# Patient Record
Sex: Male | Born: 1955 | Race: White | Hispanic: No | Marital: Married | Smoking: Former smoker
Health system: Southern US, Community
[De-identification: ages and names within clinical notes are randomized; demographics above are authoritative.]

## PROBLEM LIST (undated history)

## (undated) DIAGNOSIS — R0600 Dyspnea, unspecified: Secondary | ICD-10-CM

## (undated) DIAGNOSIS — G4730 Sleep apnea, unspecified: Secondary | ICD-10-CM

## (undated) DIAGNOSIS — K219 Gastro-esophageal reflux disease without esophagitis: Secondary | ICD-10-CM

## (undated) DIAGNOSIS — E669 Obesity, unspecified: Secondary | ICD-10-CM

## (undated) DIAGNOSIS — I429 Cardiomyopathy, unspecified: Secondary | ICD-10-CM

## (undated) DIAGNOSIS — I5022 Chronic systolic (congestive) heart failure: Secondary | ICD-10-CM

## (undated) DIAGNOSIS — Z8719 Personal history of other diseases of the digestive system: Secondary | ICD-10-CM

## (undated) DIAGNOSIS — I4819 Other persistent atrial fibrillation: Secondary | ICD-10-CM

## (undated) DIAGNOSIS — Z7901 Long term (current) use of anticoagulants: Secondary | ICD-10-CM

## (undated) DIAGNOSIS — I499 Cardiac arrhythmia, unspecified: Secondary | ICD-10-CM

## (undated) DIAGNOSIS — N200 Calculus of kidney: Secondary | ICD-10-CM

## (undated) HISTORY — PX: APPENDECTOMY: SHX54

## (undated) HISTORY — PX: HERNIA REPAIR: SHX51

## (undated) HISTORY — DX: Obesity, unspecified: E66.9

## (undated) HISTORY — PX: EYE SURGERY: SHX253

## (undated) HISTORY — DX: Cardiomyopathy, unspecified: I42.9

## (undated) HISTORY — DX: Long term (current) use of anticoagulants: Z79.01

## (undated) HISTORY — DX: Dyspnea, unspecified: R06.00

---

## 2004-04-01 ENCOUNTER — Ambulatory Visit (HOSPITAL_COMMUNITY): Admission: RE | Admit: 2004-04-01 | Discharge: 2004-04-01 | Payer: Self-pay | Admitting: General Surgery

## 2004-08-02 ENCOUNTER — Ambulatory Visit (HOSPITAL_COMMUNITY): Admission: RE | Admit: 2004-08-02 | Discharge: 2004-08-02 | Payer: Self-pay | Admitting: General Surgery

## 2009-12-24 ENCOUNTER — Ambulatory Visit: Payer: Self-pay | Admitting: Internal Medicine

## 2009-12-24 DIAGNOSIS — G4730 Sleep apnea, unspecified: Secondary | ICD-10-CM

## 2010-01-07 ENCOUNTER — Ambulatory Visit (HOSPITAL_BASED_OUTPATIENT_CLINIC_OR_DEPARTMENT_OTHER): Admission: RE | Admit: 2010-01-07 | Discharge: 2010-01-07 | Payer: Self-pay | Admitting: Internal Medicine

## 2010-01-07 ENCOUNTER — Encounter: Payer: Self-pay | Admitting: Internal Medicine

## 2010-03-06 ENCOUNTER — Ambulatory Visit (HOSPITAL_BASED_OUTPATIENT_CLINIC_OR_DEPARTMENT_OTHER): Admission: RE | Admit: 2010-03-06 | Discharge: 2010-03-06 | Payer: Self-pay | Admitting: Internal Medicine

## 2010-03-06 ENCOUNTER — Encounter: Payer: Self-pay | Admitting: Internal Medicine

## 2010-03-16 ENCOUNTER — Ambulatory Visit: Payer: Self-pay | Admitting: Internal Medicine

## 2010-09-10 ENCOUNTER — Ambulatory Visit: Payer: Self-pay | Admitting: Internal Medicine

## 2010-09-12 ENCOUNTER — Telehealth: Payer: Self-pay | Admitting: Internal Medicine

## 2010-09-20 ENCOUNTER — Encounter: Payer: Self-pay | Admitting: Internal Medicine

## 2010-10-17 ENCOUNTER — Ambulatory Visit: Admit: 2010-10-17 | Payer: Self-pay | Admitting: Internal Medicine

## 2010-11-07 NOTE — Assessment & Plan Note (Signed)
Summary: sleep apnea/jd   Primary Provider/Referring Provider:  Windle Guard  CC:  Sleep Consultl .  History of Present Illness: December 24, 2009- 55 yoM referred by Dr Jeannetta Nap concerned about sleep disordered breathing.  His wife tells him he snores loudly and stops breathing. He is aware of daytime tiredess. Bedtime 10-11PM, sleep latency 10-30 minutes, unaware of waking during the night before up at 5AM. Weight gained 30 lbs in 2 years. 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 Epworth score 12/24.  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  Smoking Status:  quit Packs/Day:  1ppd  Review of Systems       The patient complains of acid heartburn, indigestion, weight change, and nasal congestion/difficulty breathing through nose.  The patient denies shortness of breath with activity, shortness of breath at rest, productive cough, non-productive cough, coughing up blood, chest pain, irregular heartbeats, abdominal pain, difficulty swallowing, sore throat, and tooth/dental problems.    Vital Signs:  Patient profile:   55 year old male Height:      74 inches Weight:      222.6 pounds BMI:     28.68 O2 Sat:      94 % on Room air Pulse rate:   83 / minute BP sitting:   118 / 80  (left arm)  Vitals Entered By: Renold Genta RCP, LPN (December 24, 2009 2:53 PM)  O2 Sat at Rest %:  94% O2 Flow:  Room air CC: Sleep Consultl   Is Patient Diabetic? Yes Comments Medications reviewed with patient .Armita Donzetta Matters RCP, LPN  December 24, 2009 3:06 PM    Physical Exam  Additional Exam:  General: A/Ox3; pleasant and cooperative, NAD, overweight SKIN: no rash, lesions NODES: no lymphadenopathy HEENT: [place]/AT, EOM- WNL, Conjuctivae- clear, PERRLA, TM-WNL, Nose- clear, Throat- clear and wnl, Mallampati  III NECK: Supple w/ fair ROM, JVD- none, normal carotid impulses w/o bruits Thyroid- normal to palpation CHEST: Clear to P&A HEART: RRR, no m/g/r heard ABDOMEN: Soft and nl; nml bowel sounds; no organomegaly or masses noted WJX:BJYN, nl pulses, no edema  NEURO: Grossly intact to observation      Impression & Recommendations:  Problem # 1:  OBSTRUCTIVE SLEEP APNEA (ICD-327.23)  Good history and consistent exam. We will schedule sleep study. Initial discussion including driving responsibility and weight loss..  Problem # 2:  ATOPIC RHINITIS (ICD-477.9)  Mainly episodic. He has noted more nasal congestion and drainage, more of the time lately. We discussed this and will give antibiotic once to see if it makes a difference acutely.  Medications Added to Medication List This Visit: 1)  Zithromax Z-pak 250 Mg Tabs (Azithromycin) .... 2 today then one daily  Other Orders: New Patient Level III (82956) Sleep Disorder Referral (Sleep Disorder)  Patient Instructions: 1)  Please schedule a follow-up appointment in 1 month. 2)  See [facility] to schedule NPSG 3)  Sample Nasonex steroid nasal spray-  1 or  2 puffs each nostril once every day at bedtime for maintenance use. 4)  Consider benadryl at night if needed to help sleep 5)  Consider a nonsedating antihistamine otc like allegra or claritin for drainage, or a decongestant like Sudafed-PE if stopped up. 6)  Script for Z pak antibiotic Prescriptions: ZITHROMAX Z-PAK 250 MG TABS (AZITHROMYCIN) 2 today then one daily  #1 pak x 0   Entered and Authorized by:   Waymon Budge  MD   Signed by:   Waymon Budge MD on 12/24/2009   Method used:   Print then Give to Patient   RxID:   [id number]

## 2010-11-07 NOTE — Assessment & Plan Note (Signed)
Summary: ROV/ MBW   Primary Provider/Referring Provider:  Windle Guard  CC:  Follow up, pt had sleep study in June 1011, and pt states he has less snoring per his wife.  History of Present Illness: History of Present Illness: December 24, 2009- 53 yoM referred by Dr Jeannetta Nap concerned about sleep disordered breathing.  His wife tells him he snores loudly and stops breathing. He is aware of daytime tiredess. Bedtime 10-11PM, sleep latency 10-30 minutes, unaware of waking during the night before up at 5AM. Weight gained 30 lbs in 2 years. 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 Epworth score 12/24.  September 10, 2010- OSA NPSG 01/14/10- Severe OSA AHI 104.6/hr CPAP 03/06/10- 21 cwp > AHI 0/ hr.  Wife says she is sleeping better because she doesn't hear him snore as much. Not as tired in daytime as he used to be, before any treatment. We reviewed his study reports and discussed treatment options. He is very interested in learning about the surgically implanted splints.   Preventive Screening-Counseling & Management  Alcohol-Tobacco     Smoking Status: quit     Year Quit: 2007  Current Medications (verified): 1)  None  Allergies (verified): No Known Drug Allergies  Past History:  Past Medical History: rhinosinusitis Obstructive sleep apnea.- NPSG 01/07/10- AHI 104.6/ hr                                         - CPAP titration 03/06/10- 21 cwp/ AHI 0 /hr  Review of Systems      See HPI  The patient denies shortness of breath with activity, shortness of breath at rest, productive cough, non-productive cough, coughing up blood, chest pain, irregular heartbeats, acid heartburn, indigestion, loss of appetite, weight change, abdominal pain, difficulty swallowing, sore throat, tooth/dental problems, headaches, nasal congestion/difficulty breathing through nose, and sneezing.    Vital Signs:  Patient profile:   55 year old  male Height:      73 inches Weight:      221.6 pounds O2 Sat:      91 % on Room air Pulse rate:   73 / minute BP sitting:   140 / 80  (left arm)  Vitals Entered By: Renold Genta RCP, LPN (September 10, 2010 3:26 PM)  O2 Flow:  Room air CC: Follow up, pt had sleep study in June 1011, pt states he has less snoring per his wife Comments Medications reviewed with patient Renold Genta RCP, LPN  September 10, 2010 3:28 PM    Physical Exam  Additional Exam:  General: A/Ox3; pleasant and cooperative, NAD, overweight, soft spoken SKIN: no rash, lesions NODES: no lymphadenopathy HEENT: [place]/AT, EOM- WNL, Conjuctivae- clear, PERRLA, TM-WNL, Nose- clear, Throat- clear and wnl, Mallampati  III NECK: Supple w/ fair ROM, JVD- none, normal carotid impulses w/o bruits Thyroid- normal to palpation CHEST: Clear to P&A HEART: RRR, no m/g/r heard ABDOMEN: overweight XBJ:YNWG, nl pulses, no edema  NEURO: Grossly intact to observation      Impression & Recommendations:  Problem # 1:  OBSTRUCTIVE SLEEP APNEA (ICD-327.23)  Severe OSA. I discussed the studies, medical concerns and treatment options. He is willing to try CPAP initially but asks that we let him look at other options with particular interest in surgical splints. I will refer him to Dr Jearld Fenton  who has that experience. I will order BiPAP to relieve pressure since standard CPAP won't reach 21 cwp. His insurance may want [place] to try CPAP first and document intolerance.   Medications Added to Medication List This Visit: 1)  Bipap 21/ 15   Other Orders: Est. Patient Level IV (04540) ENT Referral (ENT) DME Referral (DME)  Patient Instructions: 1)  Please schedule a follow-up appointment in 1 month. 2)  See [facility] to set up trial of CPAP/  BiPAP 3)  and also referral to ENT Dr Jearld Fenton

## 2010-11-07 NOTE — Progress Notes (Signed)
Summary: CPAP- try first at 200 cwp for documentation  Phone Note Call from Patient   Caller: [email or web address]  Call For: Denay Pleitez Summary of Call: does not have documentation of pt been tried on bipap in sleep ctr but titrated on cpap 21 wants to know if they([facility]) can start pt on cpap@20  and let him try and fail if he does so we can have documentation of tried and failed cpap before putting on bip or do you want a cpap/bipap study done at sleep ctr  Initial call taken by: Oneita Jolly,  September 12, 2010 11:12 AM  Follow-up for Phone Call        Appropriate to try first on CPAP at 20.  Follow-up by: Waymon Budge MD,  September 12, 2010 12:22 PM

## 2011-02-21 NOTE — Op Note (Signed)
NAMEJOLAN, Charles Kent                          ACCOUNT NO.:  [id number]   MEDICAL RECORD NO.:  [id number]                   PATIENT TYPE:  AMB   LOCATION:  DAY                                  FACILITY:  [facility]   PHYSICIAN:  Angelia Mould. Derrell Lolling, M.D.             DATE OF BIRTH:  06-Jun-1956   DATE OF PROCEDURE:  04/01/2004  DATE OF DISCHARGE:                                 OPERATIVE REPORT   PREOPERATIVE DIAGNOSIS:  Bilateral inguinal hernias.   POSTOPERATIVE DIAGNOSIS:  Bilateral inguinal hernias.   OPERATION PERFORMED:  Laparoscopic preperitoneal repair of bilateral  inguinal hernias with polypropylene mesh.   SURGEON:  Angelia Mould. Derrell Lolling, M.D.   OPERATIVE INDICATIONS:  This is a 55 year old white male who had noticed  bulge and pain in his right groin for several months.  On exam, he has a  moderately large right inguinal hernia and on the left side, he has a small  inguinal hernia.  He is brought to the operating room for repair of his  bilateral inguinal hernias.   OPERATIVE TECHNIQUE:  Following the induction of general endotracheal  anesthesia, the patient's bladder was emptied with a Foley catheter.  The  Foley was left in place, but the balloon was deflated.  The abdomen and  genitalia were prepped and draped in a sterile fashion.  Then 0.5% Marcaine  with epinephrine was used as a local infiltration anesthetic.  A transverse  incision was made just below the lower rim of the umbilicus.  The fascia was  incised transversely, exposing the right rectus muscle.  The right rectus  muscle was retracted laterally, and a balloon dissector was inserted behind  the right rectus muscle in the midline down to the symphysis pubis.  The  video camera was inserted.  The dissector ballon was inflated manually with  air under direct vision.  We had good deployment of the balloon bilaterally.  We held the balloon in place for about five minutes.  Hemostasis was  excellent.  The dissector  balloon was then deflated and removed.  The  insufflating trocar was inserted in the right rectus sheath.  The balloon  was inflated with 10 cc of saline and secured and connected to the  insufflator at 12 mmHg.  A video camera was then reinserted.  We had good  visualization.  We put a 5 mm trocar in the midline and used that to dissect  laterally.  We ultimately placed a 5 mm trocar in the right lower quadrant  and a 5 mm trocar in the left lower quadrant.   We found that the patient had a large indirect hernia on the right and a  large direct hernia on the left.  We cleaned off the symphysis pubis and  Cooper's ligaments bilaterally.  We identified the peri-epigastric vessels.  On the right side, we had some bleeding from the inferior epigastric vessels  just below  the umbilicus, and we had to control this with metal clips.  On  the right side, we dissected the cord structures free and cleaned off the  peritoneum.  We found a large indirect sac which we dissected out and away  from the cord and well back above the anterior superior iliac spine.  We had  a small hole in this, and we closed this with metal clips.  We had to vent  the peritoneal space with a Veress needle in the right upper quadrant, which  worked quite well.  The direct space on the right was a little bit weak but  no obvious hernia.  On the left side, the patient had a large direct hernia.  There was some fat caught in this, which we debrided away.  We then turned  out attention to the cord structures of the left side, which we mobilized  and isolated.  We found the edge of the peritoneum, and although it was not  really up into the internal ring, we dissected it back above the level of  the anterior superior iliac spine.   We repaired both sides with a large piece Bard 3D convex mesh. We started on  the right side and inserted the mesh and positioned it carefully so that it  overlapped the symphysis pubis and Cooper's  ligament slightly and slightly  overlapped the midline.  We secured the mesh to the superior rim of the  symphysis pubis and all along the Cooper's ligament with about five metal  screw tacks.  In the midline, we smoothed the mesh out and tacked it to the  midline and then across the posterior belly of the rectus muscle.  Laterally, we secured the mesh to the abdominal wall above the iliopubic  tract.  We were very careful to be sure that we could palpate the tacker  through the abdominal wall to be sure that all of the 5 mm screw tacks were  placed above the iliopubic tract.  This completed the repair on the right,  and it looked fine.   On the left side, we did essentially the same thing.  A large piece of 3D  Bard mesh was inserted and positioned, so as to overlap the symphysis pubis  and Cooper's ligaments slightly.  It was then tacked to the superior rim of  the symphysis pubis and Cooper's ligament, up along the midline, across the  posterior belly of the rectus muscle, and then laterally once again, we  secured the mesh laterally, to be sure that we could palpate the tip of the  tacker through the abdominal wall to be sure that all of the fixation was  above the iliopubic tract.  We then inspected the repair on both sides and  everything looked good.  The trocars were removed under direct vision, and  there was no bleeding from the trocar sites.  The pneumoperitoneum was  released.  The fascia at the umbilicus was closed with two figure-of-eight  sutures of 0 Vicryl.  Skin incisions were closed with subcuticular sutures  of 4-0 Vicryl and Steri-Strips.  Clean bandages were placed, and the patient  was taken to the recovery room in stable condition.  Estimated blood loss  was about 10 cc.  Complications were none.  All sponge, needle, and  instrument counts were correct.  Angelia Mould. Derrell Lolling, M.D.   HMI/MEDQ  D:  04/01/2004  T:   04/01/2004  Job:  [id number]   cc:   Oley Balm. Georgina Pillion, M.D.  [address]  [place]  [place] [address]  Fax: [phone]

## 2011-02-21 NOTE — Op Note (Signed)
NAMEDEVEON, KISIEL                ACCOUNT NO.:  [id number]   MEDICAL RECORD NO.:  [id number]          PATIENT TYPE:  AMB   LOCATION:  DAY                          FACILITY:  [facility]   PHYSICIAN:  Angelia Mould. Derrell Lolling, M.D.DATE OF BIRTH:  September 15, 1956   DATE OF PROCEDURE:  08/02/2004  DATE OF DISCHARGE:                                 OPERATIVE REPORT   PREOPERATIVE DIAGNOSES:  Recurrent right inguinal hernia.   POSTOPERATIVE DIAGNOSES:  Recurrent right inguinal hernia.   OPERATION PERFORMED:  Repair recurrent right inguinal hernia with mesh  Armanda Heritage repair).   SURGEON:  Angelia Mould. Derrell Lolling, M.D.   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 He presented with bilateral inguinal hernias  earlier this year. On April 01, 2004, he underwent laparoscopic preperitoneal  repair bilateral inguinal hernias with mesh. That surgery was uneventful.  He has developed a large recurrence on the right side and although he is not  very symptomatic it is quite noticeable. He was offered elective repair. He  is brought to the operating room electively.   TECHNIQUE:  Following the induction of general endotracheal anesthesia, the  patient's right groin was prepped and draped in a sterile fashion.  He was  given intravenous antibiotics.  An oblique incision was made overlying the  right inguinal canal. Marcaine 0.5% with epinephrine was used as a local  infiltration anesthetic.  Dissection was carried down through the  subcutaneous tissue exposing the aponeurosis of the external oblique. The  external oblique was incised in the direction of its fibers opening of the  external inguinal ring. Self retaining retractors were placed.  The cord  structures were then dissected and mobilized and encircled with a Penrose  drain.   I found that he had a very large direct inguinal hernia. Basically the floor  of the inguinal canal was bulging fairly  significantly.  I dissected this  sac away from the cord structures and found that the weakness extended all  the way just about to the pubic tubercle.  I opened the sac and found that  it was a fairly simple sac but thick walled. I felt within the abdominal  cavity and felt no masses or abnormal findings. I felt superiorly and felt  that I felt the lower edge of the mesh, suggesting to me that the mesh  perhaps had pulled off of Cooper's ligament.  I then closed the direct  hernia sac with a pursestring suture of 2-0 Vicryl. I reduced the direct  hernia sac and then I oversewed it with an imbricating suture of 2-0 Vicryl.   The cord structures were examined. They were quite thin and skeletonized and  there was really no evidence of any other hernia sac.   The floor of the inguinal canal was repaired and reinforced with an onlay  graft of polypropylene mesh. A 3 inch x 6 inch piece of mesh was brought to  the operative field and trimmed at the corners to fit the wound.  I made  this as large  as possible. The mesh was sutured so as to generously overlap  the fascia at the pubic tubercle, along the inguinal ligament inferiorly.  Several interrupted sutures of 2-0 Prolene were placed medially and  superiorly to secure the medial and superior rim of the mesh to the  abdominal wall fascia in the plane just deep to the external oblique.  Running suture of 2-0 Prolene was carried out superiorly to complete the  suture lines. The mesh was incised laterally so as to wrap around the cord  structures in the internal ring. The tails of the mesh were overlapped  laterally and the suture lines completed. A couple of extra sutures were  placed to close the mesh down laterally. This provided a very secure repair  both medial and lateral but allowed an adequate opening for the cord  structures. Hemostasis was excellent. The wounds were irrigated with saline.  It should be mentioned that a couple of  sensory nerves were traced lateral  to the internal ring, clamped with a hemostat, divided and tied off with 2-0  silk ties. The external oblique was closed with a running suture of 2-0  Vicryl. Scarpa's fascia was closed with 3-0 Vicryl sutures. The skin was  closed with a running subcuticular suture of 4-0 Vicryl and Steri-Strips.  Clean bandages were placed and the patient taken to the recovery room in  stable condition. Estimated blood loss was about 10-15 mL. Complications  none. Sponge, needle and instrument counts were correct.     Hayw   HMI/MEDQ  D:  08/02/2004  T:  08/02/2004  Job:  [id number]   cc:   Oley Balm. Georgina Pillion, M.D.  [address]  [place]  [place] [address]  Fax: [phone]

## 2011-10-21 ENCOUNTER — Other Ambulatory Visit (HOSPITAL_COMMUNITY): Payer: Self-pay | Admitting: Gastroenterology

## 2011-10-21 DIAGNOSIS — K219 Gastro-esophageal reflux disease without esophagitis: Secondary | ICD-10-CM

## 2011-10-27 ENCOUNTER — Ambulatory Visit (HOSPITAL_COMMUNITY)
Admission: RE | Admit: 2011-10-27 | Discharge: 2011-10-27 | Disposition: A | Discharge status: Home / Self Care | Payer: BC Managed Care – PPO | Source: Ambulatory Visit | Attending: Gastroenterology | Admitting: Gastroenterology

## 2011-10-27 DIAGNOSIS — K449 Diaphragmatic hernia without obstruction or gangrene: Secondary | ICD-10-CM | POA: Insufficient documentation

## 2011-10-27 DIAGNOSIS — K219 Gastro-esophageal reflux disease without esophagitis: Secondary | ICD-10-CM | POA: Insufficient documentation

## 2011-12-19 ENCOUNTER — Ambulatory Visit (HOSPITAL_COMMUNITY)
Admission: RE | Admit: 2011-12-19 | Discharge: 2011-12-19 | Disposition: A | Discharge status: Home / Self Care | Payer: BC Managed Care – PPO | Source: Ambulatory Visit | Attending: Otolaryngology | Admitting: Otolaryngology

## 2011-12-19 ENCOUNTER — Encounter (HOSPITAL_COMMUNITY): Payer: Self-pay | Admitting: Pharmacy Technician

## 2011-12-19 ENCOUNTER — Encounter (HOSPITAL_COMMUNITY)
Admission: RE | Admit: 2011-12-19 | Discharge: 2011-12-19 | Disposition: A | Discharge status: Home / Self Care | Payer: BC Managed Care – PPO | Source: Ambulatory Visit | Attending: Otolaryngology | Admitting: Otolaryngology

## 2011-12-19 ENCOUNTER — Encounter (HOSPITAL_COMMUNITY): Payer: Self-pay

## 2011-12-19 DIAGNOSIS — Z01818 Encounter for other preprocedural examination: Secondary | ICD-10-CM | POA: Insufficient documentation

## 2011-12-19 HISTORY — DX: Gastro-esophageal reflux disease without esophagitis: K21.9

## 2011-12-19 HISTORY — DX: Personal history of other diseases of the digestive system: Z87.19

## 2011-12-19 HISTORY — DX: Sleep apnea, unspecified: G47.30

## 2011-12-19 LAB — CBC
HCT: 48.4 % (ref 39.0–52.0)
Hemoglobin: 16.6 g/dL (ref 13.0–17.0)
MCH: 33.2 pg (ref 26.0–34.0)
MCHC: 34.3 g/dL (ref 30.0–36.0)
MCV: 96.8 fL (ref 78.0–100.0)
Platelets: 251 K/uL (ref 150–400)
RBC: 5 MIL/uL (ref 4.22–5.81)
RDW: 14.5 % (ref 11.5–15.5)
WBC: 9.7 K/uL (ref 4.0–10.5)

## 2011-12-19 LAB — DIFFERENTIAL
Basophils Absolute: 0 K/uL (ref 0.0–0.1)
Basophils Relative: 0 % (ref 0–1)
Eosinophils Absolute: 0.2 K/uL (ref 0.0–0.7)
Eosinophils Relative: 2 % (ref 0–5)
Lymphocytes Relative: 15 % (ref 12–46)
Lymphs Abs: 1.4 K/uL (ref 0.7–4.0)
Monocytes Absolute: 0.9 10*3/uL (ref 0.1–1.0)
Monocytes Relative: 9 % (ref 3–12)
Neutro Abs: 7.2 K/uL (ref 1.7–7.7)
Neutrophils Relative %: 74 % (ref 43–77)

## 2011-12-19 LAB — BASIC METABOLIC PANEL
BUN: 12 mg/dL (ref 6–23)
CO2: 29 mEq/L (ref 19–32)
Chloride: 101 mEq/L (ref 96–112)
GFR calc Af Amer: >90 mL/min (ref >90)
Glucose, Bld: 101 mg/dL — ABNORMAL HIGH (ref 70–99)
Potassium: 4.3 mEq/L (ref 3.5–5.1)

## 2011-12-19 LAB — BASIC METABOLIC PANEL WITH GFR
Calcium: 9.4 mg/dL (ref 8.4–10.5)
Creatinine, Ser: 0.75 mg/dL (ref 0.50–1.35)
GFR calc non Af Amer: >90 mL/min (ref >90)
Sodium: 140 meq/L (ref 135–145)

## 2011-12-19 LAB — SURGICAL PCR SCREEN
MRSA, PCR: NEGATIVE
Staphylococcus aureus: NEGATIVE

## 2011-12-19 NOTE — Pre-Procedure Instructions (Signed)
20 Charles Kent  12/19/2011  Your procedure is scheduled on:  Wednesday, March 20th.  Report to [facility] at 8:00AM.  Call this number if you have problems the morning of surgery: [phone]   Remember:   Do not eat food:After Midnight.  May have clear liquids: up to 4 Hours before arrival.  Clear liquids include soda, tea, black coffee, apple or grape juice, broth.  Take these medicines the morning of surgery with A SIP OF WATER: Prilosec   Do not wear jewelry, make-up or nail polish.  Do not wear lotions, powders, or perfumes. You may wear deodorant.  Do not shave 48 hours prior to surgery.  Do not bring valuables to the hospital.  Contacts, dentures or bridgework may not be worn into surgery.  Leave suitcase in the car. After surgery it may be brought to your room.  For patients admitted to the hospital, checkout time is 11:00 AM the day of discharge.   Patients discharged the day of surgery will not be allowed to drive home.  Name and phone number of your driver: ==  Special Instructions: CHG Shower Use Special Wash: 1/2 bottle night before surgery and 1/2 bottle morning of surgery.   Please read over the following fact sheets that you were given: Pain Booklet, Coughing and Deep Breathing, MRSA Information and Surgical Site Infection Prevention

## 2011-12-24 ENCOUNTER — Ambulatory Visit (HOSPITAL_COMMUNITY): Payer: BC Managed Care – PPO | Admitting: Anesthesiology

## 2011-12-24 ENCOUNTER — Encounter (HOSPITAL_COMMUNITY): Payer: Self-pay | Admitting: *Deleted

## 2011-12-24 ENCOUNTER — Encounter (HOSPITAL_COMMUNITY): Payer: Self-pay | Admitting: Otolaryngology

## 2011-12-24 ENCOUNTER — Encounter (HOSPITAL_COMMUNITY)
Admission: RE | Disposition: A | Discharge status: Home / Self Care | Payer: Self-pay | Source: Ambulatory Visit | Attending: Otolaryngology

## 2011-12-24 ENCOUNTER — Encounter (HOSPITAL_COMMUNITY): Payer: Self-pay | Admitting: Anesthesiology

## 2011-12-24 ENCOUNTER — Ambulatory Visit (HOSPITAL_COMMUNITY)
Admission: RE | Admit: 2011-12-24 | Discharge: 2011-12-25 | Disposition: A | Discharge status: Home / Self Care | Payer: BC Managed Care – PPO | Source: Ambulatory Visit | Attending: Otolaryngology | Admitting: Otolaryngology

## 2011-12-24 DIAGNOSIS — K219 Gastro-esophageal reflux disease without esophagitis: Secondary | ICD-10-CM | POA: Insufficient documentation

## 2011-12-24 DIAGNOSIS — J342 Deviated nasal septum: Secondary | ICD-10-CM | POA: Insufficient documentation

## 2011-12-24 DIAGNOSIS — J309 Allergic rhinitis, unspecified: Secondary | ICD-10-CM

## 2011-12-24 DIAGNOSIS — E669 Obesity, unspecified: Secondary | ICD-10-CM | POA: Insufficient documentation

## 2011-12-24 DIAGNOSIS — J343 Hypertrophy of nasal turbinates: Secondary | ICD-10-CM | POA: Insufficient documentation

## 2011-12-24 DIAGNOSIS — R0609 Other forms of dyspnea: Secondary | ICD-10-CM | POA: Insufficient documentation

## 2011-12-24 DIAGNOSIS — M129 Arthropathy, unspecified: Secondary | ICD-10-CM | POA: Insufficient documentation

## 2011-12-24 DIAGNOSIS — R0989 Other specified symptoms and signs involving the circulatory and respiratory systems: Secondary | ICD-10-CM | POA: Insufficient documentation

## 2011-12-24 DIAGNOSIS — G4733 Obstructive sleep apnea (adult) (pediatric): Secondary | ICD-10-CM

## 2011-12-24 HISTORY — PX: UVULOPALATOPHARYNGOPLASTY: SHX827

## 2011-12-24 HISTORY — PX: NASAL SEPTOPLASTY W/ TURBINOPLASTY: SHX2070

## 2011-12-24 SURGERY — UPPP (UVULOPALATOPHARYNGOPLASTY)
Anesthesia: General | Site: Nose | Laterality: Bilateral | Wound class: Clean Contaminated

## 2011-12-24 MED ORDER — OXYMETAZOLINE HCL 0.05 % NA SOLN
NASAL | Status: DC | PRN
  Administered 2011-12-24: 1 via NASAL

## 2011-12-24 MED ORDER — CEFAZOLIN SODIUM 1-5 GM-% IV SOLN: 1.0000 g | Freq: Three times a day (TID) | INTRAVENOUS | Status: DC

## 2011-12-24 MED ORDER — COCAINE HCL POWD
Status: DC | PRN
  Administered 2011-12-24: 200 mg via NASAL

## 2011-12-24 MED ORDER — CEFAZOLIN SODIUM 1-5 GM-% IV SOLN
1.0000 g | Freq: Three times a day (TID) | INTRAVENOUS | Status: DC
  Administered 2011-12-24 – 2011-12-25 (×3): 1 g via INTRAVENOUS
  Filled 2011-12-24 (×5): qty 50

## 2011-12-24 MED ORDER — LACTATED RINGERS IV SOLN
INTRAVENOUS | Status: DC
  Administered 2011-12-24: 10:00:00 via INTRAVENOUS

## 2011-12-24 MED ORDER — ONDANSETRON HCL 4 MG/2ML IJ SOLN
INTRAMUSCULAR | Status: DC | PRN
  Administered 2011-12-24: 4 mg via INTRAVENOUS

## 2011-12-24 MED ORDER — PROTRIPTYLINE HCL 5 MG PO TABS
5.0000 mg | ORAL_TABLET | Freq: Every day | ORAL | Status: DC
  Filled 2011-12-24: qty 1

## 2011-12-24 MED ORDER — LABETALOL HCL 5 MG/ML IV SOLN
INTRAVENOUS | Status: DC | PRN
  Administered 2011-12-24: 10 mg via INTRAVENOUS
  Administered 2011-12-24: 5 mg via INTRAVENOUS

## 2011-12-24 MED ORDER — SUCCINYLCHOLINE CHLORIDE 20 MG/ML IJ SOLN
INTRAMUSCULAR | Status: DC | PRN
  Administered 2011-12-24: 100 mg via INTRAVENOUS

## 2011-12-24 MED ORDER — OXYMETAZOLINE HCL 0.05 % NA SOLN
2.0000 | Freq: Two times a day (BID) | NASAL | Status: DC
  Administered 2011-12-24: 2 via NASAL
  Filled 2011-12-24: qty 15

## 2011-12-24 MED ORDER — AMITRIPTYLINE HCL 10 MG PO TABS
10.0000 mg | ORAL_TABLET | Freq: Every day | ORAL | Status: DC
  Administered 2011-12-24: 10 mg via ORAL
  Filled 2011-12-24 (×2): qty 1

## 2011-12-24 MED ORDER — TAMSULOSIN HCL 0.4 MG PO CAPS
0.4000 mg | ORAL_CAPSULE | Freq: Every day | ORAL | Status: DC
  Filled 2011-12-24 (×2): qty 1

## 2011-12-24 MED ORDER — COCAINE HCL POWD: 200.0000 mg | Freq: Once | Status: DC

## 2011-12-24 MED ORDER — MIDAZOLAM HCL 5 MG/5ML IJ SOLN
INTRAMUSCULAR | Status: DC | PRN
  Administered 2011-12-24: 1 mg via INTRAVENOUS

## 2011-12-24 MED ORDER — BACITRACIN ZINC 500 UNIT/GM EX OINT
TOPICAL_OINTMENT | CUTANEOUS | Status: DC | PRN
  Administered 2011-12-24: 1 via TOPICAL

## 2011-12-24 MED ORDER — PHENYLEPHRINE HCL 10 MG/ML IJ SOLN
INTRAMUSCULAR | Status: DC | PRN
  Administered 2011-12-24: 40 ug via INTRAVENOUS

## 2011-12-24 MED ORDER — HYDROCODONE-ACETAMINOPHEN 7.5-500 MG/15ML PO SOLN
10.0000 mL | ORAL | Status: DC | PRN
  Administered 2011-12-24 (×2): 15 mL via ORAL
  Administered 2011-12-25: 10 mL via ORAL
  Filled 2011-12-24 (×2): qty 15
  Filled 2011-12-24: qty 30

## 2011-12-24 MED ORDER — LIDOCAINE-EPINEPHRINE 1 %-1:100000 IJ SOLN
INTRAMUSCULAR | Status: DC | PRN
  Administered 2011-12-24: 20 mL

## 2011-12-24 MED ORDER — CEFAZOLIN SODIUM 1-5 GM-% IV SOLN
INTRAVENOUS | Status: AC
  Filled 2011-12-24: qty 50

## 2011-12-24 MED ORDER — PROPOFOL 10 MG/ML IV EMUL
INTRAVENOUS | Status: DC | PRN
  Administered 2011-12-24: 200 mg via INTRAVENOUS

## 2011-12-24 MED ORDER — 0.9 % SODIUM CHLORIDE (POUR BTL) OPTIME
TOPICAL | Status: DC | PRN
  Administered 2011-12-24: 1000 mL

## 2011-12-24 MED ORDER — SUFENTANIL CITRATE 50 MCG/ML IV SOLN
INTRAVENOUS | Status: DC | PRN
  Administered 2011-12-24: 20 ug via INTRAVENOUS
  Administered 2011-12-24 (×2): 5 ug via INTRAVENOUS
  Administered 2011-12-24: 15 ug via INTRAVENOUS

## 2011-12-24 MED ORDER — PANTOPRAZOLE SODIUM 20 MG PO TBEC
20.0000 mg | DELAYED_RELEASE_TABLET | Freq: Every day | ORAL | Status: DC
  Filled 2011-12-24 (×2): qty 1

## 2011-12-24 MED ORDER — HYDROCODONE-ACETAMINOPHEN 7.5-500 MG/15ML PO SOLN: 10.0000 mL | ORAL | Status: DC | PRN

## 2011-12-24 MED ORDER — LACTATED RINGERS IV SOLN
INTRAVENOUS | Status: DC | PRN
  Administered 2011-12-24 (×3): via INTRAVENOUS

## 2011-12-24 MED ORDER — SODIUM CHLORIDE 0.9 % IV SOLN
INTRAVENOUS | Status: DC
  Administered 2011-12-24 (×2): via INTRAVENOUS

## 2011-12-24 MED ORDER — DEXAMETHASONE SODIUM PHOSPHATE 10 MG/ML IJ SOLN
INTRAMUSCULAR | Status: DC | PRN
  Administered 2011-12-24: 10 mg via INTRAVENOUS

## 2011-12-24 MED ORDER — CEFAZOLIN SODIUM 1-5 GM-% IV SOLN
INTRAVENOUS | Status: DC | PRN
  Administered 2011-12-24: 1 g via INTRAVENOUS

## 2011-12-24 MED ORDER — HYDROMORPHONE HCL PF 1 MG/ML IJ SOLN: 0.2500 mg | INTRAMUSCULAR | Status: DC | PRN

## 2011-12-24 SURGICAL SUPPLY — 32 items
CANISTER SUCTION 2500CC (MISCELLANEOUS) ×3 IMPLANT
CLEANER TIP ELECTROSURG 2X2 (MISCELLANEOUS) ×3 IMPLANT
CLOTH BEACON ORANGE TIMEOUT ST (SAFETY) ×3 IMPLANT
COVER MAYO STAND STRL (DRAPES) ×3 IMPLANT
DECANTER SPIKE VIAL GLASS SM (MISCELLANEOUS) ×3 IMPLANT
DRESSING NASAL POPE 10X1.5X2.5 (GAUZE/BANDAGES/DRESSINGS) ×2 IMPLANT
DRESSING TELFA 8X3 (GAUZE/BANDAGES/DRESSINGS) ×3 IMPLANT
DRSG NASAL POPE 10X1.5X2.5 (GAUZE/BANDAGES/DRESSINGS) ×3
ELECT COATED BLADE 2.86 ST (ELECTRODE) ×3 IMPLANT
ELECT REM PT RETURN 9FT ADLT (ELECTROSURGICAL) ×3
ELECTRODE REM PT RTRN 9FT ADLT (ELECTROSURGICAL) ×2 IMPLANT
GLOVE ECLIPSE 6.5 STRL STRAW (GLOVE) ×3 IMPLANT
GLOVE SURG SS PI 7.5 STRL IVOR (GLOVE) ×3 IMPLANT
GOWN PREVENTION PLUS XLARGE (GOWN DISPOSABLE) ×3 IMPLANT
GOWN STRL NON-REIN LRG LVL3 (GOWN DISPOSABLE) ×3 IMPLANT
KIT BASIN OR (CUSTOM PROCEDURE TRAY) ×3 IMPLANT
KIT ROOM TURNOVER OR (KITS) ×3 IMPLANT
NEEDLE SPNL 25GX3.5 QUINCKE BL (NEEDLE) ×3 IMPLANT
NS IRRIG 1000ML POUR BTL (IV SOLUTION) ×3 IMPLANT
PAD ARMBOARD 7.5X6 YLW CONV (MISCELLANEOUS) ×3 IMPLANT
PENCIL BUTTON HOLSTER BLD 10FT (ELECTRODE) ×3 IMPLANT
SPECIMEN JAR SMALL (MISCELLANEOUS) ×3 IMPLANT
SPONGE TONSIL 1 RF SGL (DISPOSABLE) IMPLANT
SUT PLAIN 4 0 ~~LOC~~ 1 (SUTURE) ×3 IMPLANT
SUT PLAIN 5 0 P 3 18 (SUTURE) ×3 IMPLANT
SYR 3ML LL SCALE MARK (SYRINGE) ×3 IMPLANT
TOWEL OR 17X24 6PK STRL BLUE (TOWEL DISPOSABLE) ×3 IMPLANT
TOWEL OR 17X26 10 PK STRL BLUE (TOWEL DISPOSABLE) ×3 IMPLANT
TOWEL OR NON WOVEN STRL DISP B (DISPOSABLE) ×3 IMPLANT
TRAY ENT MC OR (CUSTOM PROCEDURE TRAY) ×3 IMPLANT
WATER STERILE IRR 1000ML POUR (IV SOLUTION) ×3 IMPLANT
YANKAUER SUCT BULB TIP NO VENT (SUCTIONS) ×3 IMPLANT

## 2011-12-24 NOTE — Anesthesia Preprocedure Evaluation (Signed)
Anesthesia Evaluation  Patient identified by MRN, date of birth, ID band Patient awake    Reviewed: Allergy & Precautions, H&P , Patient's Chart, lab work & pertinent test results  History of Anesthesia Complications Negative for: history of anesthetic complications  Airway Mallampati: III TM Distance: >3 FB Neck ROM: Full  Mouth opening: Limited Mouth Opening  Dental No notable dental hx. (+) Dental Advisory Given and Teeth Intact   Pulmonary sleep apnea (AHI 18, desat to 82%) , former smoker (quit 15 years) breath sounds clear to auscultation  Pulmonary exam normal       Cardiovascular negative cardio ROS  Rhythm:Regular Rate:Normal     Neuro/Psych negative neurological ROS     GI/Hepatic Neg liver ROS, GERD-  Controlled,  Endo/Other  negative endocrine ROS  Renal/GU negative Renal ROS     Musculoskeletal   Abdominal (+) + obese,   Peds  Hematology negative hematology ROS (+)   Anesthesia Other Findings   Reproductive/Obstetrics                           Anesthesia Physical Anesthesia Plan  ASA: III  Anesthesia Plan: General   Post-op Pain Management:    Induction: Intravenous  Airway Management Planned: Oral ETT  Additional Equipment:   Intra-op Plan:   Post-operative Plan: Extubation in OR  Informed Consent: I have reviewed the patients History and Physical, chart, labs and discussed the procedure including the risks, benefits and alternatives for the proposed anesthesia with the patient or authorized representative who has indicated his/her understanding and acceptance.   Dental advisory given  Plan Discussed with: CRNA and Surgeon  Anesthesia Plan Comments: (Plan routine monitors, GETA with VideoGlide intubation  )        Anesthesia Quick Evaluation

## 2011-12-24 NOTE — H&P (Signed)
NAMEJORDI, LACKO NO.:  [id number]  MEDICAL RECORD NO.:  [id number]  LOCATION:                                 FACILITY:  PHYSICIAN:  Hermelinda Medicus, M.D.   DATE OF BIRTH:  1956/03/06  DATE OF ADMISSION:  12/24/2011 DATE OF DISCHARGE:                             HISTORY & PHYSICAL   This patient is a 56 year old male, who kindly came in with a history of sleep apnea.  He was waking himself up.  He is being awakened by the snoring and obstruction and was frightening his family.  He has been on CPAP and CPAP titration where he took 21 CWP.  His AHI was 0 per hour, but he could not tolerate the CPAP and refuses to use it.  He has had an occasional PVC.  His stage 3 sleep was 0%, REM sleep was 22-23%, but he still wakes up fatigued, falls asleep when he tries to read a book or watch TV.  He is retired and has back and leg issues and history of trauma and arthritis.  He weighs 210 pounds, and his BMI is 27.  His apnea-hypopnea index is 18.1.  His respiratory disturbance index is 24.4.  Longest apnea was 40 seconds.  His longest hypopnea was 80 seconds.  His mean oxygen desaturation was 94%.  O2 nadir was 82%.  PAST MEDICAL HISTORY:  He has had appendicitis and appendectomy and a hernia repair.  He takes Prilosec.  His further medications are tamsulosin 0.4 extended release 24 hour, multivitamin tablet, potassium 99 mg tablet 2-3 daily, meloxicam 7.5 mg tablet p.r.n., protriptyline 5 mg tablet nightly, omeprazole 25 mg delayed release.  He denies on review of systems any respiratory, dyspnea, cough, wheezing, or hemoptysis.  Cardiovascular is in his HPI which will be further reviewed.  He has had a complete cardiovascular workup, which is acceptable for planned surgery under Dr. Viann Fish.  His 12-lead EKG shows some left axis deviation and left anterior hemiblock, but no cardiac contraindications for surgery.  He also has benign prostate hypertrophy,  and he is mildly overweight.  He denies any headaches, stroke, or TIA.  Denies any depression or anxiety.  Abdominal history of gastroesophageal reflux disease.  GU, hesitancy and has some cramps in his hands and arthritis in his knees.  He has been cleared for surgery by Dr. Viann Fish.  PHYSICAL EXAMINATION:  VITAL SIGNS:  Blood pressure of 120/60, 120/64 standing.  His weight is 218, height 73 inches, BMI of 29. HEENT:  His ears are completely clear.  Tympanic membranes look to be within normal limits and move well.  I removed some cerumen on his initial office visit.  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  The right side and left side of both showed turbinate hypertrophy.  His nasopharynx is clear and free of any ulceration, mass, or lesion.  His larynx is clear of ulceration, mass, or lesion.  True cords, false cords, epiglottis, base of tongue, lateral pharyngeal walls are clear of ulceration or mass.  He shows no evidence  of any infection. He also shows a slight amount of reflux in the post cricoid region but appears to be under control.  His tongue rides fairly high, is reasonably high and his palate is low, and his mouth is quite small, its AP diameter is somewhat shallow as well.  His tonsils are very small. His neck is full pushing the tongue rather high.  He is trying to lose some weight and he is totally intolerant of the CPAP, and we have no choice here but to suggest a septal reconstruction and turbinate reduction as I think this will markedly improve any future use of CPAP that is ever elected, but I think we can get him freed of his CPAP if we can trim his uvula and palate, and if he can lose some weight, his tongue will not ride so high, his palate will not be so low, and his nasal airway will be markedly improved. On further exam of his neck, he is free of any thyromegaly,  cervical adenopathy, or mass. CHEST:  Normal symmetry.  Clear to auscultation and percussion. CARDIAC:  Regular exam.  Normal S1, S2, S3, S4.  No murmurs, gallops, or rubs. ABDOMEN:  Soft, nontender.  No masses.  No aneurysm. EXTREMITIES:  Femoral pulses are within normal limits.  No bruits deformities of his extremities and back.  He does have the knee arthritis.  Normal muscle strength. NEUROLOGIC:  Oriented x3.  Cranial nerves intact.  True cords, false cords, epiglottis, base of tongue, lateral pharyngeal walls are completely clear and true cord mobility, gag reflex, tongue mobility, EOMs, facial nerve are all symmetrical as is shoulder strength.  His chest x-ray also shows no acute disease.  The patient is on for a septal reconstruction turbinate reduction and palatopharyngoplasty under general endotracheal anesthesia.  He will be kept overnight for monitoring on 23-hour observation.          ______________________________ Hermelinda Medicus, M.D.     JC/MEDQ  D:  12/23/2011  T:  12/23/2011  Job:  [id number]  cc:   Windle Guard, M.D. Georga Hacking, M.D.

## 2011-12-24 NOTE — Preoperative (Signed)
Beta Blockers   Reason not to administer Beta Blockers:Not Applicable 

## 2011-12-24 NOTE — Op Note (Signed)
NAME:  Charles Kent, Charles Kent                     ACCOUNT NO.:  MEDICAL RECORD NO.:  [id number]  LOCATION:                                 FACILITY:  PHYSICIAN:  Hermelinda Medicus, M.D.   DATE OF BIRTH:  08-Sep-1956  DATE OF PROCEDURE: DATE OF DISCHARGE:                              OPERATIVE REPORT   This patient is a failed CPAP patient who has had the CPAP turned way up to 20 and has a severe septal deviation and a low large uvula and palate, small mouth, high-arched tongue, and a full neck.  He is aware of the risks and gains that he is going to have some pain postoperatively, possibly even palatopharyngoplasty and that he is going to be on a soft bland diet for about 10 days and cannot travel for 10 days, and his nasal airway will be markedly improved postoperatively but maybe have some swelling postop.  PREOPERATIVE DIAGNOSIS:  Septal deviation, turbinate hypertrophy with low redundant palate with high-arched tongue with failed continuous positive airway pressure with an oxygen nadir of 80%, starting time 94%.  POSTOPERATIVE DIAGNOSIS:  Septal deviation, turbinate hypertrophy with low redundant palate with high-arched tongue with failed continuous positive airway pressure with an oxygen nadir of 80%, starting time 94%.  OPERATION:  Septal reconstruction, turbinate reduction, and a uvulopalatoplasty under general endotracheal anesthesia, Dr. Jairo Ben.  SURGEON:  Hermelinda Medicus, MD  ANESTHESIA:  1% Xylocaine with epinephrine as well injected into the septum and turbinates and 200 mg of topical cocaine.  PROCEDURE:  The patient was placed in supine position.  Under general endotracheal anesthesia, the patient was positioned in such a way and prepped and the usual head drape was placed.  The nose was first approached, and the septum was grossly deviated to the left blocking about 80-90% of the septum of this airway.  On that side, he had very large redundant turbinates as well  and then he had a spur on the posterior vomerine region as well.  We made an incision anterior and the hemitransfixion incision on the anterior portion of the quadrilateral cartilage worked back along the posterior quadrilateral cartilage elevating the mucoperichondrium and mucoperiosteum and then using the open and closed Jansen-Middleton's, we removed the septal deviation cartilage and bony ethmoid region.  Once this was completed, the septum was brought back to the midline and then we approached the posterior aspect of the septum, made a 2nd incision more posteriorly, and removed the septal spur and the vomerine spur using the 4-mm chisel.  Once this was completed, we then established the septum in the midline, we reduced the turbinates by just outfracturing, we did not remove any mucous membrane.  We also used the Elmed to cauterize the anterior turbinates to decrease the amount of mucous membrane edema.  Once this was completed, we closed with 4-0 plain catgut through and through septal suture as a blanket stitch and then we placed a Telfa pack on the left side and a 7.5 anesthesia trumpet on the right.  Attention was then carried to the oral cavity where a Crowe-Davis mouth gag was placed. The palate was very low and redundant  and tongue rode high.  We trimmed the palate using the Bovie electrocoagulation for hemostasis and trimmed the very large uvula to a point where we elevated the palatal and uvular region approximately 7-10 mm.  Once this was completed and all hemostasis was established with a 5-0 plain catgut, bringing the anterior and posterior mucous membrane into approximation, we then suctioned the stomach, we then checked with the nasopharynx for any further bleeding.  We removed the Telfa pack and placed a Merocel pack on the left side and then placed the anesthesia trumpet on the right. This will help him to continue his nasal airway postoperatively in the immediate  postop course.  The patient will be taken to recovery room and will be on 5100, on pulse oximeter, and his followup will be then in 5 days, then 10 days, 3 weeks, 6 weeks, 3 months, and 6 months.          ______________________________ Hermelinda Medicus, M.D.     JC/MEDQ  D:  12/24/2011  T:  12/24/2011  Job:  [id number]  cc:   Windle Guard, M.D. Georga Hacking, M.D.

## 2011-12-24 NOTE — Anesthesia Procedure Notes (Addendum)
Performed by: Glendora Score A   Procedure Name: Intubation Date/Time: 12/24/2011 10:03 AM Performed by: Glendora Score A Pre-anesthesia Checklist: Patient identified, Emergency Drugs available, Suction available and Patient being monitored Patient Re-evaluated:Patient Re-evaluated prior to inductionOxygen Delivery Method: Circle system utilized Preoxygenation: Pre-oxygenation with 100% oxygen Intubation Type: IV induction and Rapid sequence Tube type: Oral Tube size: 7.0 mm Number of attempts: 1 Airway Equipment and Method: Stylet and Video-laryngoscopy Placement Confirmation: ETT inserted through vocal cords under direct vision,  positive ETCO2 and breath sounds checked- equal and bilateral Secured at: 23 cm Tube secured with: Tape Dental Injury: Teeth and Oropharynx as per pre-operative assessment

## 2011-12-24 NOTE — Progress Notes (Signed)
Post op status excellent SAO2 93-95 on room air        No bleeding      We checked slight tenderness of calves         No evidence of    DVT

## 2011-12-24 NOTE — Anesthesia Postprocedure Evaluation (Signed)
  Anesthesia Post-op Note  Patient: Charles Kent  Procedure(s) Performed: Procedure(s) (LRB): UVULOPALATOPHARYNGOPLASTY (UPPP) (Bilateral) NASAL SEPTOPLASTY WITH TURBINATE REDUCTION (Bilateral)  Patient Location: PACU  Anesthesia Type: General  Level of Consciousness: awake, alert  and oriented  Airway and Oxygen Therapy: Patient Spontanous Breathing  Post-op Pain: mild  Post-op Assessment: Post-op Vital signs reviewed, Patient's Cardiovascular Status Stable, Respiratory Function Stable, Patent Airway, No signs of Nausea or vomiting and Pain level controlled  Post-op Vital Signs: Reviewed and stable  Complications: No apparent anesthesia complications

## 2011-12-24 NOTE — H&P (Signed)
Dictate [id number]

## 2011-12-24 NOTE — Transfer of Care (Signed)
Immediate Anesthesia Transfer of Care Note  Patient: Charles Kent  Procedure(s) Performed: Procedure(s) (LRB): UVULOPALATOPHARYNGOPLASTY (UPPP) (Bilateral) NASAL SEPTOPLASTY WITH TURBINATE REDUCTION (Bilateral)  Patient Location: PACU  Anesthesia Type: General  Level of Consciousness: awake, alert  and patient cooperative  Airway & Oxygen Therapy: Patient Spontanous Breathing and Patient connected to face mask oxygen  Post-op Assessment: Report given to PACU RN  Post vital signs: Reviewed and stable  Complications: No apparent anesthesia complications

## 2011-12-24 NOTE — Op Note (Signed)
Dictated [id number]

## 2011-12-24 NOTE — Consult Note (Signed)
Charles Kent, WICKHAM NO.: [id number]  MEDICAL RECORD NO.: [id number]  LOCATION: FACILITY:  PHYSICIAN: Hermelinda Medicus, M.D. DATE OF BIRTH: 1956/08/13  DATE OF ADMISSION: 12/24/2011  DATE OF DISCHARGE:  HISTORY & PHYSICAL  This patient is a 56 year old male, who kindly came in with a history of  sleep apnea. He was waking himself up. He is being awakened by the  snoring and obstruction and was frightening his family. He has been on  CPAP and CPAP titration where he took 21 CWP. His AHI was 0 per hour,  but he could not tolerate the CPAP and refuses to use it. He has had an  occasional PVC. His stage 3 sleep was 0%, REM sleep was 22-23%, but he  still wakes up fatigued, falls asleep when he tries to read a book or  watch TV. He is retired and has back and leg issues and history of  trauma and arthritis. He weighs 210 pounds, and his BMI is 27. His  apnea-hypopnea index is 18.1. His respiratory disturbance index is  24.4. Longest apnea was 40 seconds. His longest hypopnea was 80  seconds. His mean oxygen desaturation was 94%. O2 nadir was 82%.  PAST MEDICAL HISTORY: He has had appendicitis and appendectomy and a  hernia repair.  He takes Prilosec. His further medications are tamsulosin 0.4 extended  release 24 hour, multivitamin tablet, potassium 99 mg tablet 2-3 daily,  meloxicam 7.5 mg tablet p.r.n., protriptyline 5 mg tablet nightly,  omeprazole 25 mg delayed release.  He denies on review of systems any respiratory, dyspnea, cough,  wheezing, or hemoptysis. Cardiovascular is in his HPI which will be  further reviewed. He has had a complete cardiovascular workup, which is  acceptable for planned surgery under Dr. Viann Fish. His 12-lead  EKG shows some left axis deviation and left anterior hemiblock, but no  cardiac contraindications for surgery. He also has benign prostate  hypertrophy, and he is mildly overweight. He denies any headaches,  stroke, or TIA. Denies any  depression or anxiety. Abdominal history of  gastroesophageal reflux disease. GU, hesitancy and has some cramps in  his hands and arthritis in his knees. He has been cleared for surgery  by Dr. Viann Fish.  PHYSICAL EXAMINATION: VITAL SIGNS: Blood pressure of 120/60, 120/64  standing. His weight is 218, height 73 inches, BMI of 29.  HEENT: His ears are completely clear. Tympanic membranes look to be  within normal limits and move well. I removed some cerumen on his  initial office visit. He has a very narrow nose and has a septal  deviation to the left which is blocking his nose considerably on that  left side. The right side and left side of both showed turbinate  hypertrophy. His nasopharynx is clear and free of any ulceration, mass,  or lesion. His larynx is clear of ulceration, mass, or lesion. True  cords, false cords, epiglottis, base of tongue, lateral pharyngeal walls  are clear of ulceration or mass. He shows no evidence of any infection.  He also shows a slight amount of reflux in the post cricoid region but  appears to be under control. His tongue rides fairly high, is  reasonably high and his palate is low, and his mouth is quite small, its  AP diameter is somewhat shallow as well. His tonsils are very small.  His neck is full pushing the tongue rather high. He is trying to lose  some weight and he is totally intolerant of the CPAP,  and we have no  choice here but to suggest a septal reconstruction and turbinate  reduction as I think this will markedly improve any future use of CPAP  that is ever elected, but I think we can get him freed of his CPAP if we  can trim his uvula and palate, and if he can lose some weight, his  tongue will not ride so high, his palate will not be so low, and his  nasal airway will be markedly improved.  On further exam of his neck, he is free of any thyromegaly, cervical  adenopathy, or mass.  CHEST: Normal symmetry. Clear to auscultation and  percussion.  CARDIAC: Regular exam. Normal S1, S2, S3, S4. No murmurs, gallops, or  rubs.  ABDOMEN: Soft, nontender. No masses. No aneurysm.  EXTREMITIES: Femoral pulses are within normal limits. No bruits  deformities of his extremities and back. He does have the knee  arthritis. Normal muscle strength.  NEUROLOGIC: Oriented x3. Cranial nerves intact. True cords, false  cords, epiglottis, base of tongue, lateral pharyngeal walls are  completely clear and true cord mobility, gag reflex, tongue mobility,  EOMs, facial nerve are all symmetrical as is shoulder strength.  His chest x-ray also shows no acute disease.  The patient is on for a septal reconstruction turbinate reduction and  palatopharyngoplasty under general endotracheal anesthesia. He will be  kept overnight for monitoring on 23-hour observation.  ______________________________  Hermelinda Medicus, M.D.

## 2011-12-25 NOTE — Discharge Summary (Signed)
NAMETYREASE, VANDEBERG NO.:  [id number]  MEDICAL RECORD NO.:  [id number]  LOCATION:  5121                         FACILITY:  [facility]  PHYSICIAN:  Hermelinda Medicus, M.D.   DATE OF BIRTH:  1956/06/12  DATE OF ADMISSION:  12/24/2011 DATE OF DISCHARGE:                              DISCHARGE SUMMARY   This patient is a 56 year old male, who came in with a history of sleep apnea.  He had waking himself up.  He was snoring, had obstruction, he was frightening his family.  He was on CPAP and the CPAP titration was set up at 21 CWP.  His AHI was 0 when he had the CPAP, but his CPAP was just so strong that he was refusing to use it and could not tolerate it. He had an occasional PVC, but his stage III status was 0%, REM sleep was 22-23% and he still wakes up fatigued.  When he tries to read a book or watch TV, he falls asleep.  He is retired because he has some leg issues and back issues from trauma and arthritis, but is just so fatigued, he really cannot work very well and is of concern to his wife because he is obstructing.  His BMI is 27, he weighs 210.  His apnea-hypopnea index was 18.1, respiratory disturbance RDI was 24.4, longest apnea was 40 seconds, hypopnea longest was 80 seconds.  Mean oxygen desaturation range from 94, O2 nadir of 82%.  PAST MEDICAL HISTORY:  He had appendicitis, he had an appendectomy, and he has had a hernia repair and he does have medial leg issues with back issues.  MEDICATIONS: 1. Tamsulosin 0.4 extended release. 2. Multivitamin. 3. Potassium 99 mg tablet. 4. Meloxicam 7.5, which was discontinued several days before. 5.     Protriptyline 5 nightly. 5. Omeprazole 25 delayed release.  His review of systems is one of no history of respiratory disease, cough, wheezing, or hemoptysis.  Cardiovascular was reviewed and he is in good condition.  Acceptable for surgery.  Twelve-lead EKG showed some left axis deviation with left anterior  hemiblock, but no cardiac contraindication or surgery.  He had a benign prostate hypertrophy, mildly overweight.  Denies any headache, stroke, or TIA.  Denies any depression or anxiety.  His abdominal history; he does have some mild reflux which I think will be improved with resolving his sleep apnea issues.  GU:  Some hesitancy.  He had some cramps and arthritis in his knees and legs.  PHYSICAL EXAMINATION:  VITAL SIGNS:  Reveals a blood pressure of 120/60, 120/64 standing.  Weight 218, height 73, BMI 27-29 calculated. EARS:  Clear tympanic membranes, move well. 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  Turbinate hypertrophy as well.  Concha bullosa on the right, and a large spur posteriorly as well.  Nasopharynx is free of any ulceration, mass, or lesion. THROAT:  His oral cavity is small.  His uvula and palate are very low, and his uvula is large.  Tongue rides high because of a full neck. NECK:  Free of any thyromegaly, cervical adenopathy, or mass.  His larynx is clear.  True cords. CHEST:  Clear.  No rales, rhonchi, or wheezes.  Chest x-ray was within normal limits. CARDIOVASCULAR:  Normal S1, S2.  No murmurs or gallops.  EXTREMITIES: Unremarkable.  Legs with mild tenderness.  Femoral pulses are within normal limits. ABDOMEN:  Within normal limits. NEUROLOGIC:  Alert and oriented x3.  Cranial nerves intact.  Oriented x3.  The patient underwent a septal reconstruction, turbinate reduction with uvulopalatoplasty and did very well postop.  He is being observed at 5100 for his observation for sleep apnea and a pulse oximeter.  He dropped down to the low 80s but was never needed to have any oxygen. One time, he was 56 but was not given oxygen.  He has done very well, bounced right back.  I observed him personally on 2 different occasions. When he was sleeping, he fell to 85-83%.  He does have packing on 1 side of his nose.  We  irrigated his nose on the opposite side, and the packing will be removed most likely this afternoon.  His laboratory reveals no evidence of any MRSA.  His blood sugar was 101, and he is being watched for this.  His EKG was read out as sinus rhythm, moderate left axis deviation.  His lab reveals a white count of 9.7, hematocrit 48.4, platelets 251, potassium was 4.3, sodium 140, CO2 of 29.  His both MRSA and Staph aureus were negative, so on his evaluation overnight, he has done very well.  He has a unilateral packing in his nose.  His oral cavity is clear and free of any hematoma. Minimal swelling on his palate and uvula, and he will be discharged today, with removal of packing most likely this afternoon or tomorrow. The patient has tolerated the procedure well.  His diagnoses are sleep apnea with septal deviation with nasal obstruction with failed CPAP with a very low, large uvula and palate; slight elevation of his BMI, which was 27-29; O2 nadir was 82%; history of appendectomy; history of hernia repair;  history of back and leg trauma with history of calf tenderness, no calf tenderness at this time.          ______________________________ Hermelinda Medicus, M.D.     JC/MEDQ  D:  12/25/2011  T:  12/25/2011  Job:  [id number]  cc:   Windle Guard, M.D. Georga Hacking, M.D.

## 2011-12-25 NOTE — Discharge Summary (Signed)
dictation number  [id number]

## 2011-12-25 NOTE — Discharge Summary (Signed)
NAMEREMMY, RIFFE NO.:  [id number]  MEDICAL RECORD NO.:  [id number]  LOCATION:  5121                         FACILITY:  [facility]  PHYSICIAN:  Hermelinda Medicus, M.D.   DATE OF BIRTH:  Mar 25, 1956  DATE OF ADMISSION:  12/24/2011 DATE OF DISCHARGE:                              DISCHARGE SUMMARY   HISTORY:  This patient is a 56 year old male who has had a history of sleep apneas, had been on CPAP with the CPAP titration, was up to 21 CWP.  His AHI was 0 on CPAP, but he could not tolerate the pressure of this CPAP use. She had an occasional PVC.  His stage 3 sleep was 0%, REM was 22-23%.  He wakes up fatigued, falls asleep and tries to read a book, watch TV.  He is retired, has a history of back and leg trauma, weighs 210, BMI 27.  Hypopnea index is 18, RDI was 24.4, longest apnea was 40 seconds, the longest hypopnea was 80 seconds and the mean oxygen desaturation 94%, O2 nadir of 82%.  PAST MEDICAL HISTORY:  Appendicitis and appendectomy and hernia repair.  MEDICATIONS:  Listed in the in the chart as tamsulosin 0.4, meloxicam 7.5,  potassium 99 daily, omeprazole 25 all once a day.  REVIEW OF SYSTEMS:  He denies any respiratory, cardiovascular symptoms or bowel or bladder symptoms except he does have some nighttime urination.  PHYSICAL EXAMINATION:  VITAL SIGNS: Blood pressure 120/60, weight 218, height 73, BMI 27. HEENT: Ears are clear. Membranes move well.  Oral cavity is clear except he has got a very low palate and uvula.  His septum is grossly deviated blocking his nose approximately 90% to the left. NECK: Free of any thyromegaly, cervical adenopathy or mass. No salivary gland abnormalities. The teeth and gums are unremarkable.  His larynx is clear.  True cords, false cords, epiglottis, base of tongue, lateral pharyngeal walls are clear.  CHEST: Clear.  Also chest x-ray is clear and his EKG is within normal limits.  No opening snaps, murmurs  or gallops. ABDOMEN: Mildly overweight but within normal. EXTREMITIES: Unremarkable.  No bruits.  He had a septal reconstruction, turbinate reduction under general endotracheal anesthesia along with a palpable fundoplasty and has done extremely well postoperatively. He was observed carefully on pulse oximeter.  I observed the sleeping. He never fell below 85% on my observation. Nurses said one time he fell down to 80, just 78%, was not given oxygen. He will have one packing in his nose, which will be removed today which will increase his nasal airway.  LABORATORY DATA:  He was staph negative and MRSA negative.  Also his white count was 9.9, his platelets were 251, hematocrit 48.4. Chloride was 101, and his BUN was 12 and his potassium 4.3.  DIAGNOSES:  That of septal deviation, turbinate hypertrophy with failed CPAP with redundant uvula and palate and his further diagnoses are back and leg trauma. He is disabled and history of appendectomy.          ______________________________ Hermelinda Medicus, M.D.     JC/MEDQ  D:  12/25/2011  T:  12/25/2011  Job:  [id number]  cc:   Andrey Campanile  Jeannetta Nap, M.D.

## 2011-12-26 ENCOUNTER — Encounter (HOSPITAL_COMMUNITY): Payer: Self-pay | Admitting: Otolaryngology

## 2012-09-27 ENCOUNTER — Other Ambulatory Visit: Payer: Self-pay | Admitting: Family Medicine

## 2012-09-27 ENCOUNTER — Ambulatory Visit
Admission: RE | Admit: 2012-09-27 | Discharge: 2012-09-27 | Disposition: A | Discharge status: Home / Self Care | Payer: BC Managed Care – PPO | Source: Ambulatory Visit | Attending: Family Medicine | Admitting: Family Medicine

## 2012-09-27 DIAGNOSIS — M79673 Pain in unspecified foot: Secondary | ICD-10-CM

## 2014-09-19 ENCOUNTER — Other Ambulatory Visit: Payer: Self-pay | Admitting: Family Medicine

## 2014-09-19 DIAGNOSIS — M792 Neuralgia and neuritis, unspecified: Secondary | ICD-10-CM

## 2014-09-23 ENCOUNTER — Ambulatory Visit
Admission: RE | Admit: 2014-09-23 | Discharge: 2014-09-23 | Disposition: A | Discharge status: Home / Self Care | Payer: BC Managed Care – PPO | Source: Ambulatory Visit | Attending: Family Medicine | Admitting: Family Medicine

## 2014-09-23 DIAGNOSIS — M792 Neuralgia and neuritis, unspecified: Secondary | ICD-10-CM

## 2015-06-22 ENCOUNTER — Telehealth: Payer: Self-pay | Admitting: Internal Medicine

## 2015-06-22 NOTE — Telephone Encounter (Signed)
Spoke with pt's wife. Advised her that CY is not taking any new pulmonary pts. Offered an appointment with another MD, she agreed. Pt has been scheduled with MW on 06/29/15 at 11:15am. Nothing further was needed.

## 2015-06-29 ENCOUNTER — Ambulatory Visit (INDEPENDENT_AMBULATORY_CARE_PROVIDER_SITE_OTHER): Payer: Medicare Other | Admitting: Internal Medicine

## 2015-06-29 ENCOUNTER — Encounter: Payer: Self-pay | Admitting: Internal Medicine

## 2015-06-29 VITALS — BP 114/80 | HR 68 | Ht 72.0 in | Wt 233.2 lb

## 2015-06-29 DIAGNOSIS — Z23 Encounter for immunization: Secondary | ICD-10-CM | POA: Diagnosis not present

## 2015-06-29 DIAGNOSIS — R0600 Dyspnea, unspecified: Secondary | ICD-10-CM | POA: Diagnosis not present

## 2015-06-29 DIAGNOSIS — E669 Obesity, unspecified: Secondary | ICD-10-CM

## 2015-06-29 NOTE — Progress Notes (Signed)
   Subjective:    Patient ID: Charles Kent., male    DOB: [birth year],    MRN: [id number]  HPI  1 yowm quit smoking 2006 with no resp symptoms at  [facility] =160 with progressive wt gain since then but still no problem until July 2016 with onset of variable  not reproduced with exertion so referred himself to pulmonary clinic 06/29/2015     06/29/2015 1st [place] [facility] office visit/ Talvin Christianson   Chief Complaint  Patient presents with  . Pulmonary Consult    Self referral. Pt c/o "labored breathing" for the past 2 months. He notices this most at rest.    onset was abrupt July 2016 at rest outside and inside = severity/ frequency, not reproduced with exertion or assoc with cp/palp/diaph and can last from one breath to sev min and spont resolves s rx/ assoc with sense of nasal and chest congestion but no purulent or excess secrtions  No obvious  Patterns in day to day or daytime variability or assoc chronic cough or cp or chest tightness, subjective wheeze or overt sinus or hb symptoms. No unusual exp hx or h/o childhood pna/ asthma or knowledge of premature birth.  Sleeping ok without nocturnal  or early am exacerbation  of respiratory  c/o's or need for noct saba. Also denies any obvious fluctuation of symptoms with weather or environmental changes or other aggravating or alleviating factors except as outlined above   Current Medications, Allergies, Complete Past Medical History, Past Surgical History, Family History, and Social History were reviewed in [employer] record.         Review of Systems  Constitutional: Negative for fever, chills, activity change, appetite change and unexpected weight change.  HENT: Positive for congestion. Negative for dental problem, postnasal drip, rhinorrhea, sneezing, sore throat, trouble swallowing and voice change.   Eyes: Negative for visual disturbance.  Respiratory: Positive for shortness of breath. Negative for cough and choking.     Cardiovascular: Negative for chest pain and leg swelling.  Gastrointestinal: Negative for nausea, vomiting and abdominal pain.  Genitourinary: Negative for difficulty urinating.  Musculoskeletal: Positive for arthralgias.  Skin: Negative for rash.  Psychiatric/Behavioral: Negative for behavioral problems and confusion.       Objective:   Physical Exam  amb wm nad   Wt Readings from Last 3 Encounters:  06/29/15 233 lb 3.2 oz (105.779 kg)  12/19/11 216 lb 8 oz (98.204 kg)  09/10/10 221 lb 9.6 oz (100.517 kg)    Vital signs reviewed    HEENT: nl dentition, turbinates, and orophanx. Nl external ear canals without cough reflex   NECK :  without JVD/Nodes/TM/ nl carotid upstrokes bilaterally   LUNGS: no acc muscle use, clear to A and P bilaterally without cough on insp or exp maneuvers   CV:  RRR  no s3 or murmur or increase in P2, no edema   ABD:  soft and nontender with nl excursion in the supine position. No bruits or organomegaly, bowel sounds nl  MS:  warm without deformities, calf tenderness, cyanosis or clubbing  SKIN: warm and dry without lesions    NEURO:  alert, approp, no deficits      No recent cxr on file > ordered but not done Labs ordered but not done:   Tsh, cbc, bnp, bmet   Note DgEs 10/27/11 Gastroesophageal reflux with ingestion of water.          Assessment & Plan:

## 2015-06-29 NOTE — Patient Instructions (Addendum)
Try prilosec otc 20mg   Take 30-60 min before first meal of the day and Pepcid ac (famotidine) 20 mg one @  bedtime until  Next visit  GERD (REFLUX)  is an extremely common cause of respiratory symptoms just like yours , many times with no obvious heartburn at all.    It can be treated with medication, but also with lifestyle changes including elevation of the head of your bed (ideally with 6 inch  bed blocks),  Smoking cessation, avoidance of late meals, excessive alcohol, and avoid fatty foods, chocolate, peppermint, colas, red wine, and acidic juices such as orange juice.  NO MINT OR MENTHOL PRODUCTS SO NO COUGH DROPS  USE SUGARLESS CANDY INSTEAD (Jolley ranchers or Stover's or Life Savers) or even ice chips will also do - the key is to swallow to prevent all throat clearing. NO OIL BASED VITAMINS - use powdered substitutes.   Please schedule a follow up office visit in 6 weeks, call sooner if needed with pfts  cxr ordered but not done, needs to do at f/u ov  Labs ordered not done, needs to complete at f/u ov     .

## 2015-06-30 ENCOUNTER — Encounter: Payer: Self-pay | Admitting: Internal Medicine

## 2015-06-30 DIAGNOSIS — E669 Obesity, unspecified: Secondary | ICD-10-CM | POA: Insufficient documentation

## 2015-06-30 NOTE — Assessment & Plan Note (Signed)
Body mass index is 31.62 kg/(m^2).  No results found for: TSH   Contributing to gerd tendency/ doe/reviewed the need and the process to achieve and maintain neg calorie balance > defer f/u primary care including intermittently monitoring thyroid status

## 2015-06-30 NOTE — Assessment & Plan Note (Addendum)
-   DgEs 10/27/11 Gastroesophageal reflux with ingestion of water - 06/29/2015  Walked RA x 3 laps @ 185 ft each stopped due to  End of study, nl pace, no sob or desat    Symptoms are markedly disproportionate to objective findings and not clear this is a lung problem but pt does appear to have difficult airway management issues. DDX of  difficult airways management all start with A and  include Adherence, Ace Inhibitors, Acid Reflux, Active Sinus Disease, Alpha 1 Antitripsin deficiency, Anxiety masquerading as Airways dz,  ABPA,  allergy(esp in young), Aspiration (esp in elderly), Adverse effects of meds,  Active smokers, A bunch of PE's (a small clot burden can't cause this syndrome unless there is already severe underlying pulm or vascular dz with poor reserve) plus two Bs  = Bronchiectasis and Beta blocker use..and one C= CHF  ? Acid (or non-acid) GERD > always difficult to exclude as up to 75% of pts in some series report no assoc GI/ Heartburn symptoms and note he has documented GERD and takes nothing for it > rec max (24h)  acid suppression and diet restrictions/ reviewed and instructions given in writing.   ? Anxiety suggested by resolution w/in a breath or two  ? Allergy/ asthma > difficult to exclude but absence with sleep or assoc of symptoms with environmental change rules strongly against   Needs to complete w/u with cxr and pfts ordered  Total time = 77m review case with pt/wife discussion/ counseling/ giving and going over instructions (see avs)

## 2015-08-10 ENCOUNTER — Ambulatory Visit: Payer: Medicare Other | Admitting: Internal Medicine

## 2015-08-24 ENCOUNTER — Ambulatory Visit: Payer: Medicare Other | Admitting: Internal Medicine

## 2016-05-22 ENCOUNTER — Ambulatory Visit (INDEPENDENT_AMBULATORY_CARE_PROVIDER_SITE_OTHER): Payer: Medicare Other | Admitting: Internal Medicine

## 2016-05-22 ENCOUNTER — Ambulatory Visit (INDEPENDENT_AMBULATORY_CARE_PROVIDER_SITE_OTHER)
Admission: RE | Admit: 2016-05-22 | Discharge: 2016-05-22 | Disposition: A | Discharge status: Home / Self Care | Payer: Medicare Other | Source: Ambulatory Visit | Attending: Internal Medicine | Admitting: Internal Medicine

## 2016-05-22 ENCOUNTER — Encounter: Payer: Self-pay | Admitting: Internal Medicine

## 2016-05-22 ENCOUNTER — Other Ambulatory Visit (INDEPENDENT_AMBULATORY_CARE_PROVIDER_SITE_OTHER): Payer: Medicare Other

## 2016-05-22 VITALS — BP 128/78 | HR 71 | Ht 72.0 in | Wt 245.4 lb

## 2016-05-22 DIAGNOSIS — R0600 Dyspnea, unspecified: Secondary | ICD-10-CM

## 2016-05-22 LAB — CBC WITH DIFFERENTIAL/PLATELET
BASOS ABS: 0 10*3/uL (ref 0.0–0.1)
Basophils Relative: 0.4 % (ref 0.0–3.0)
EOS ABS: 0.3 10*3/uL (ref 0.0–0.7)
Eosinophils Relative: 4.8 % (ref 0.0–5.0)
HEMATOCRIT: 44.5 % (ref 39.0–52.0)
Hemoglobin: 15.3 g/dL (ref 13.0–17.0)
LYMPHS ABS: 1.6 10*3/uL (ref 0.7–4.0)
Lymphocytes Relative: 22.9 % (ref 12.0–46.0)
MCHC: 34.5 g/dL (ref 30.0–36.0)
MCV: 96.3 fl (ref 78.0–100.0)
MONO ABS: 0.8 10*3/uL (ref 0.1–1.0)
Monocytes Relative: 11.8 % (ref 3.0–12.0)
NEUTROS PCT: 60.1 % (ref 43.0–77.0)
Neutro Abs: 4.2 10*3/uL (ref 1.4–7.7)
Platelets: 235 10*3/uL (ref 150.0–400.0)
RBC: 4.62 Mil/uL (ref 4.22–5.81)
RDW: 14.7 % (ref 11.5–15.5)
WBC: 7 10*3/uL (ref 4.0–10.5)

## 2016-05-22 MED ORDER — PANTOPRAZOLE SODIUM 40 MG PO TBEC: 40.0000 mg | DELAYED_RELEASE_TABLET | Freq: Every day | ORAL | 2 refills | Status: DC

## 2016-05-22 MED ORDER — FAMOTIDINE 20 MG PO TABS: ORAL_TABLET | ORAL | 2 refills | Status: DC

## 2016-05-22 NOTE — Progress Notes (Signed)
Subjective:    Patient ID: Charles HearingHarvey R Burgener Jr., male    DOB: Jan 29, 1956,    MRN: [id number]    Brief patient profile:  [age] yowm [facility] with progressive wt gain since then but still no problem until July 2016 with onset of variable  not reproduced with exertion so referred himself to pulmonary clinic 06/29/2015    History of Present Illness  06/29/2015 1st [place] [facility] office visit/ Wert   Chief Complaint  Patient presents with  . Pulmonary Consult    Self referral. Pt c/o "labored breathing" for the past 2 months. He notices this most at rest.    onset was abrupt July 2016 at rest outside and inside = severity/ frequency, not reproduced with exertion or assoc with cp/palp/diaph and can last from one breath to sev min and spont resolves s rx/ assoc with sense of nasal and chest congestion but no purulent or excess secretions rec Try prilosec otc 20mg   Take 30-60 min before first meal of the day and Pepcid ac (famotidine) 20 mg one @  bedtime until  Next visit GERD (REFLUX) diet  Please schedule a follow up office visit in 6 weeks, call sooner if needed with pfts > not done  cxr ordered but not done, needs to do at f/u ov  Labs ordered not done, needs to complete at f/u ov    05/22/2016  f/u ov/Wert re:  Sob since July 2016 rest and activity  Chief Complaint  Patient presents with  . Follow-up    rov per Dr. Jeannetta NapElkins for SOB.  pt c/o increased SOB, difficulty obtaining a deep breath both with and without exertion.  Also worsened by heat/humidity.     progressive wt gain with sob rest and activity but  No noct symptoms  No obvious day to day or daytime variability or assoc excess/ purulent sputum or mucus plugs or hemoptysis or cp or chest tightness, subjective wheeze or overt sinus or hb symptoms. No unusual exp hx or h/o childhood pna/ asthma or knowledge of premature birth.  Sleeping ok without nocturnal  or early am exacerbation  of  respiratory  c/o's or need for noct saba. Also denies any obvious fluctuation of symptoms with weather or environmental changes or other aggravating or alleviating factors except as outlined above   Current Medications, Allergies, Complete Past Medical History, Past Surgical History, Family History, and Social History were reviewed in [facility] electronic [facility].  ROS  The following are not active complaints unless bolded sore throat, dysphagia, dental problems, itching, sneezing,  nasal congestion or excess/ purulent secretions, ear ache,   fever, chills, sweats, unintended wt loss, classically pleuritic or exertional cp,  orthopnea pnd or leg swelling, presyncope, palpitations, abdominal pain, anorexia, nausea, vomiting, diarrhea  or change in bowel or bladder habits, change in stools or urine, dysuria,hematuria,  rash, arthralgias, visual complaints, headache, numbness, weakness or ataxia or problems with walking or coordination,  change in mood/affect or memory.                          Objective:   Physical Exam  amb obese wm nad      05/22/2016        245 06/29/15 233 lb 3.2 oz (105.779 kg)  12/19/11 216 lb 8 oz (98.204 kg)  09/10/10 221 lb 9.6 oz (100.517 kg)    Vital signs reviewed  HEENT: nl dentition, turbinates, and orophanx. Nl external ear canals without cough reflex   NECK :  without JVD/Nodes/TM/ nl carotid upstrokes bilaterally   LUNGS: no acc muscle use, clear to A and P bilaterally without cough on insp or exp maneuvers   CV:  RRR  no s3 or murmur or increase in P2, no edema   ABD:  soft and nontender with nl excursion in the supine position. No bruits or organomegaly, bowel sounds nl  MS:  warm without deformities, calf tenderness, cyanosis or clubbing  SKIN: warm and dry without lesions    NEURO:  alert, approp, no deficits       Note DgEs 10/27/11 Gastroesophageal reflux with ingestion of water.   CXR PA and Lateral:    05/22/2016 :    I personally reviewed images and agree with radiology impression as follows:    Mild cardiomegaly with chronic interstitial coarsening.    Labs ordered/ reviewed:      Chemistry      Component Value Date/Time   NA 145 05/22/2016 1716   K 4.1 05/22/2016 1716   CL 107 05/22/2016 1716   CO2 29 05/22/2016 1716   BUN 11 05/22/2016 1716   CREATININE 0.99 05/22/2016 1716      Component Value Date/Time   CALCIUM 9.5 05/22/2016 1716        Lab Results  Component Value Date   WBC 7.0 05/22/2016   HGB 15.3 05/22/2016   HCT 44.5 05/22/2016   MCV 96.3 05/22/2016   PLT 235.0 05/22/2016         Lab Results  Component Value Date   TSH 1.38 05/22/2016     Lab Results  Component Value Date   PROBNP 159.0 (H) 05/22/2016                   Assessment & Plan:

## 2016-05-22 NOTE — Patient Instructions (Addendum)
Pantoprazole (protonix) 40 mg   Take  30-60 min before first meal of the day and Pepcid (famotidine)  20 mg one @  bedtime until return to office - this is the best way to tell whether stomach acid is contributing to your problem.    GERD (REFLUX)  is an extremely common cause of respiratory symptoms just like yours , many times with no obvious heartburn at all.    It can be treated with medication, but also with lifestyle changes including elevation of the head of your bed (ideally with 6 inch  bed blocks),  Smoking cessation, avoidance of late meals, excessive alcohol, and avoid fatty foods, chocolate, peppermint, colas, red wine, and acidic juices such as orange juice.  NO MINT OR MENTHOL PRODUCTS SO NO COUGH DROPS   USE SUGARLESS CANDY INSTEAD (Jolley ranchers or Stover's or Life Savers) or even ice chips will also do - the key is to swallow to prevent all throat clearing. NO OIL BASED VITAMINS - use powdered substitutes.  Please remember to go to the lab and x-ray department downstairs for your tests - we will call you with the results when they are available.       Please schedule a follow up office visit in 6 weeks, call sooner if needed  Add ? Echo on return       .

## 2016-05-23 ENCOUNTER — Telehealth: Payer: Self-pay | Admitting: Internal Medicine

## 2016-05-23 LAB — BASIC METABOLIC PANEL
BUN: 11 mg/dL (ref 6–23)
CALCIUM: 9.5 mg/dL (ref 8.4–10.5)
CO2: 29 mEq/L (ref 19–32)
CREATININE: 0.99 mg/dL (ref 0.40–1.50)
Chloride: 107 mEq/L (ref 96–112)
GFR: 81.34 mL/min (ref >60.00)
Glucose, Bld: 84 mg/dL (ref 70–99)
Potassium: 4.1 mEq/L (ref 3.5–5.1)
Sodium: 145 mEq/L (ref 135–145)

## 2016-05-23 LAB — TSH: TSH: 1.38 u[IU]/mL (ref 0.35–4.50)

## 2016-05-23 LAB — BRAIN NATRIURETIC PEPTIDE: PRO B NATRI PEPTIDE: 159 pg/mL — AB (ref 0.0–100.0)

## 2016-05-23 NOTE — Telephone Encounter (Signed)
  Notes Recorded by Nyoka Cowden, MD on 05/23/2016 at 10:44 AM EDT Call pt: Reviewed cxr and no acute change so no change in recommendations made at ov  Patient notified.  No questions or concerns at this time. Nothing further needed.

## 2016-05-25 NOTE — Assessment & Plan Note (Signed)
-   DgEs 10/27/11 Gastroesophageal reflux with ingestion of water - 06/29/2015  Walked RA x 3 laps @ 185 ft each stopped due to  End of study, nl pace, no sob or desat  - Spirometry 05/22/2016  Restrictive changes only      Symptoms are markedly disproportionate to objective findings and not clear this is a lung problem but pt does appear to have difficult airway management issues. DDX of  difficult airways management almost all start with A and  include Adherence, Ace Inhibitors, Acid Reflux, Active Sinus Disease, Alpha 1 Antitripsin deficiency, Anxiety masquerading as Airways dz,  ABPA,  Allergy(esp in young), Aspiration (esp in elderly), Adverse effects of meds,  Active smokers, A bunch of PE's (a small clot burden can't cause this syndrome unless there is already severe underlying pulm or vascular dz with poor reserve) plus two Bs  = Bronchiectasis and Beta blocker use..and one C= CHF    Adherence is always the initial "prime suspect" and is a multilayered concern that requires a "trust but verify" approach in every patient - starting with knowing how to use medications, especially inhalers, correctly, keeping up with refills and understanding the fundamental difference between maintenance and prns vs those medications only taken for a very short course and then stopped and not refilled.   ? Acid (or non-acid) GERD > always difficult to exclude as up to 75% of pts in some series report no assoc GI/ Heartburn symptoms and he has proven GERD > rec max (24h)  acid suppression and diet restrictions/ reviewed and instructions given in writing.   ? Anxiety related to wt gain and restrictive changes detected on spirometry > dx of exclusion  ? A bunch of PE's > would not explain sob at rest = activity which is what he is describing  ? chf > note bnp can be falsely decreased in chf and his obesity places him at risk of at least diastolic dysfunction > needs echo next if not improving

## 2016-05-25 NOTE — Assessment & Plan Note (Signed)
Body mass index is 33.28 kg/m.  Lab Results  Component Value Date   TSH 1.38 05/22/2016     Contributing to gerd tendency/ doe/reviewed the need and the process to achieve and maintain neg calorie balance > defer f/u primary care including intermittently monitoring thyroid status

## 2016-05-26 NOTE — Progress Notes (Signed)
Spoke with pt and notified of results per Dr. Wert. Pt verbalized understanding and denied any questions. 

## 2016-07-07 ENCOUNTER — Encounter: Payer: Self-pay | Admitting: Internal Medicine

## 2016-07-07 ENCOUNTER — Ambulatory Visit (INDEPENDENT_AMBULATORY_CARE_PROVIDER_SITE_OTHER): Payer: Medicare Other | Admitting: Internal Medicine

## 2016-07-07 VITALS — BP 132/88 | HR 58 | Ht 72.0 in | Wt 244.4 lb

## 2016-07-07 DIAGNOSIS — Z23 Encounter for immunization: Secondary | ICD-10-CM

## 2016-07-07 DIAGNOSIS — R0600 Dyspnea, unspecified: Secondary | ICD-10-CM

## 2016-07-07 NOTE — Assessment & Plan Note (Addendum)
Body mass index is 33.15 minimal change  Lab Results  Component Value Date   TSH 1.38 05/22/2016     Contributing to gerd tendency/ doe/reviewed the need and the process to achieve and maintain neg calorie balance > defer f/u primary care including intermittently monitoring thyroid status

## 2016-07-07 NOTE — Assessment & Plan Note (Addendum)
-   DgEs 10/27/11 Gastroesophageal reflux with ingestion of water - 06/29/2015  Walked RA x 3 laps @ 185 ft each stopped due to  End of study, nl pace, no sob or desat  - Spirometry 05/22/2016  Restrictive changes only      I had an extended final summary discussion with the patient and wife reviewing all relevant studies completed to date and  lasting 15 to 20 minutes of a 25 minute visit on the following issues:    1) obesity is the main issue and contributing to restrictive changes and probable GERD   2) reconditioning advised and if not making progress CPST is next step  3) pulmonary f/u is prn

## 2016-07-07 NOTE — Progress Notes (Addendum)
Subjective:    Patient ID: [name]., male    DOB: [birth year],    MRN: [id number]    Brief patient profile:  15 yowm quit smoking 2006 with no resp symptoms at  [facility] =160 with progressive wt gain since then but still no problem until July 2016 with onset of variable  not reproduced with exertion so referred himself to pulmonary clinic 06/29/2015   Has OSA > refused cpap    History of Present Illness  06/29/2015 1st [facility] office visit/ [facility]   Chief Complaint  Patient presents with  . Pulmonary Consult    Self referral. Pt c/o "labored breathing" for the past 2 months. He notices this most at rest.    onset was abrupt July 2016 at rest outside and inside = severity/ frequency, not reproduced with exertion or assoc with cp/palp/diaph and can last from one breath to sev min and spont resolves s rx/ assoc with sense of nasal and chest congestion but no purulent or excess secretions rec Try prilosec otc 20mg   Take 30-60 min before first meal of the day and Pepcid ac (famotidine) 20 mg one @  bedtime until  Next visit GERD (REFLUX) diet  Please schedule a follow up office visit in 6 weeks, call sooner if needed with pfts > not done  cxr ordered but not done, needs to do at f/u ov  Labs ordered not done, needs to complete at f/u ov    05/22/2016  f/u ov/Wert re:  Sob since July 2016 rest and activity  Chief Complaint  Patient presents with  . Follow-up    rov per Dr. Jeannetta Nap for SOB.  pt c/o increased SOB, difficulty obtaining a deep breath both with and without exertion.  Also worsened by heat/humidity.     progressive wt gain with sob rest and activity but  No noct symptoms rec Pantoprazole (protonix) 40 mg   Take  30-60 min before first meal of the day and Pepcid (famotidine)  20 mg one @  bedtime until return to office - this is the best way to tell whether stomach acid is contributing to your problem.   GERD diet  Please remember to go to the lab and x-ray department  downstairs for your tests - we will call you with the results when they are available. Please schedule a follow up office visit in 6 weeks, call sooner if needed       07/07/2016  f/u ov/Wert re: unexplained sob x 14 months / did not take gerd rx consistently  Chief Complaint  Patient presents with  . Follow-up    Pt. states breathing is about the same but a little better, Still feeling sob, wants to discuss weightloss and how it can help his breathing   No longer doe at rest, doe = MMRC1 = can walk nl pace, flat grade, can't hurry or go uphills or steps s sob    No obvious day to day or daytime variability or assoc excess/ purulent sputum or mucus plugs or hemoptysis or cp or chest tightness, subjective wheeze or overt sinus or hb symptoms. No unusual exp hx or h/o childhood pna/ asthma or knowledge of premature birth.  Sleeping ok without nocturnal  or early am exacerbation  of respiratory  c/o's or need for noct saba. Also denies any obvious fluctuation of symptoms with weather or environmental changes or other aggravating or alleviating factors except as outlined above   Current Medications, Allergies, Complete  Past Medical History, Past Surgical History, Family History, and Social History were reviewed in [facility] electronic medical record.  ROS  The following are not active complaints unless bolded sore throat, dysphagia, dental problems, itching, sneezing,  nasal congestion or excess/ purulent secretions, ear ache,   fever, chills, sweats, unintended wt loss, classically pleuritic or exertional cp,  orthopnea pnd or leg swelling, presyncope, palpitations, abdominal pain, anorexia, nausea, vomiting, diarrhea  or change in bowel or bladder habits, change in stools or urine, dysuria,hematuria,  rash, arthralgias, visual complaints, headache, numbness, weakness or ataxia or problems with walking or coordination,  change in mood/affect or memory.               Objective:    Physical Exam  amb obese wm nad      05/22/2016        245 > 07/07/2016   244  06/29/15 233 lb 3.2 oz (105.779 kg)  12/19/11 216 lb 8 oz (98.204 kg)  09/10/10 221 lb 9.6 oz (100.517 kg)    Vital signs reviewed / sats 96% RA on arrival    HEENT: nl dentition, turbinates, and orophanx. Nl external ear canals without cough reflex   NECK :  without JVD/Nodes/TM/ nl carotid upstrokes bilaterally   LUNGS: no acc muscle use, clear to A and P bilaterally without cough on insp or exp maneuvers   CV:  RRR  no s3 or murmur or increase in P2, no edema   ABD: very obese but  soft and nontender with nl excursion in the supine position. No bruits or organomegaly, bowel sounds nl  MS:  warm without deformities, calf tenderness, cyanosis or clubbing  SKIN: warm and dry without lesions    NEURO:  alert, approp, no deficits       Note DgEs 10/27/11 Gastroesophageal reflux with ingestion of water.   CXR PA and Lateral:   05/22/2016 :    I personally reviewed images and agree with radiology impression as follows:    Mild cardiomegaly with chronic interstitial coarsening.    Labs ordered/ reviewed:      Chemistry      Component Value Date/Time   NA 145 05/22/2016 1716   K 4.1 05/22/2016 1716   CL 107 05/22/2016 1716   CO2 29 05/22/2016 1716   BUN 11 05/22/2016 1716   CREATININE 0.99 05/22/2016 1716      Component Value Date/Time   CALCIUM 9.5 05/22/2016 1716        Lab Results  Component Value Date   WBC 7.0 05/22/2016   HGB 15.3 05/22/2016   HCT 44.5 05/22/2016   MCV 96.3 05/22/2016   PLT 235.0 05/22/2016         Lab Results  Component Value Date   TSH 1.38 05/22/2016     Lab Results  Component Value Date   PROBNP 159.0 (H) 05/22/2016                   Assessment & Plan:

## 2016-07-07 NOTE — Patient Instructions (Addendum)
Weight control is simply a matter of calorie balance which needs to be tilted in your favor by eating less and exercising more.  To get the most out of exercise, you need to be continuously aware that you are short of breath, but never out of breath, for 30 minutes daily. As you improve, it will actually be easier for you to do the same amount of exercise  in  30 minutes so always push to the level where you are short of breath.  If this does not result in gradual weight reduction then I strongly recommend you see a nutritionist with a food diary x 2 weeks so that we can work out a negative calorie balance which is universally effective in steady weight loss programs.  Think of your calorie balance like you do your bank account where in this case you want the balance to go down so you must take in less calories than you burn up.  It's just that simple:  Hard to do, but easy to understand.  Good luck!     Return if not making progress with your breathing as you lose weight

## 2016-07-11 ENCOUNTER — Other Ambulatory Visit: Payer: Self-pay | Admitting: Internal Medicine

## 2016-07-11 MED ORDER — FAMOTIDINE 20 MG PO TABS: ORAL_TABLET | ORAL | 0 refills | Status: DC

## 2016-11-13 ENCOUNTER — Other Ambulatory Visit (HOSPITAL_BASED_OUTPATIENT_CLINIC_OR_DEPARTMENT_OTHER): Payer: Self-pay

## 2016-11-13 DIAGNOSIS — E669 Obesity, unspecified: Secondary | ICD-10-CM

## 2016-11-13 DIAGNOSIS — G4733 Obstructive sleep apnea (adult) (pediatric): Secondary | ICD-10-CM

## 2016-12-23 ENCOUNTER — Ambulatory Visit (HOSPITAL_BASED_OUTPATIENT_CLINIC_OR_DEPARTMENT_OTHER): Payer: Medicare Other | Attending: Family Medicine | Admitting: Internal Medicine

## 2016-12-23 VITALS — Ht 72.0 in | Wt 240.0 lb

## 2016-12-23 DIAGNOSIS — G4730 Sleep apnea, unspecified: Secondary | ICD-10-CM | POA: Diagnosis not present

## 2016-12-23 DIAGNOSIS — E669 Obesity, unspecified: Secondary | ICD-10-CM | POA: Diagnosis present

## 2016-12-23 DIAGNOSIS — G4733 Obstructive sleep apnea (adult) (pediatric): Secondary | ICD-10-CM | POA: Insufficient documentation

## 2017-01-03 NOTE — Procedures (Signed)
Patient Name: Charles Kent, Charles Kent Date: 12/23/2016 Gender: Male D.O.B: 30-Jan-1956 Age (years): 60 Referring Provider: Horald Pollen Height (inches): 72 Interpreting Physician: Baird Lyons MD, ABSM Weight (lbs): 240 RPSGT: Laren Everts BMI: 33 MRN: [id number] Neck Size: 18.00 CLINICAL INFORMATION The patient is referred for a split night study with BPAP. MEDICATIONS Medications self-administered by patient taken the night of the study : none reported  SLEEP STUDY TECHNIQUE As per the AASM Manual for the Scoring of Sleep and Associated Events v2.3 (April 2016) with a hypopnea requiring 4% desaturations.  The channels recorded and monitored were frontal, central and occipital EEG, electrooculogram (EOG), submentalis EMG (chin), nasal and oral airflow, thoracic and abdominal wall motion, anterior tibialis EMG, snore microphone, electrocardiogram, and pulse oximetry. Bi-level positive airway pressure (BiPAP) was initiated when the patient met split night criteria and was titrated according to treat sleep-disordered breathing.  RESPIRATORY PARAMETERS Diagnostic  Total AHI (/hr): 80.2 RDI (/hr): 80.6 OA Index (/hr): 72.6 CA Index (/hr): 4.6 REM AHI (/hr): 58.2 NREM AHI (/hr): 83.6 Supine AHI (/hr): 81.2 Non-supine AHI (/hr): 73.95 Min O2 Sat (%): 59.00 Mean O2 (%): 84.89 Time below 88% (min): 93.6   Titration  Optimal IPAP Pressure (cm): 19 Optimal EPAP Pressure (cm): 15 AHI at Optimal Pressure (/hr): 5.1 Min O2 at Optimal Pressure (%): 84.0 Sleep % at Optimal (%): 98 Supine % at Optimal (%): 100      SLEEP ARCHITECTURE The study was initiated at 10:32:08 PM and terminated at 5:42:10 AM. The total recorded time was 430.0 minutes. EEG confirmed total sleep time was 345.0 minutes yielding a sleep efficiency of 80.2%. Sleep onset after lights out was 24.2 minutes with a REM latency of 139.5 minutes. The patient spent 18.41% of the night in stage N1 sleep, 57.97% in stage N2 sleep,  0.00% in stage N3 and 23.62% in REM. Wake after sleep onset (WASO) was 60.8 minutes. The Arousal Index was 48.0/hour.  LEG MOVEMENT DATA The total Periodic Limb Movements of Sleep (PLMS) were 0. The PLMS index was 0.00 .  CARDIAC DATA The 2 lead EKG demonstrated atrial fibrillation. The mean heart rate was 63.56 beats per minute. Other EKG findings include: PVCs.  IMPRESSIONS - Severe obstructive sleep apnea occurred during the diagnostic portion of the study (AHI = 80.2 /hour). An optimal BiPAP pressure was selected for this patient ( 19 / 15 cm of water). CPAP at tolerated pressures did not provide adequate control of events. - Mild central sleep apnea occurred during the diagnostic portion of the study (CAI = 4.6/hour). - Severe oxygen desaturation was noted during the diagnostic portion of the study (Min O2 = 59.00%). - The patient snored with Moderate snoring volume during the diagnostic portion of the study. - EKG findings include Atrial Fibrillation with mean ventricular response rate 63/ minute. - Clinically significant periodic limb movements of sleep did not occur during the study.  DIAGNOSIS - Obstructive Sleep Apnea (327.23 [G47.33 ICD-10])  RECOMMENDATIONS - Trial of BiPAP therapy on 19/15 cm H2O with a Medium size Fisher&Paykel Full Face Mask Simplus mask and heated humidification. - Be careful with alcohol, sedatives and other CNS depressants that may worsen sleep apnea and disrupt normal sleep architecture. - Sleep hygiene should be reviewed to assess factors that may improve sleep quality. - Weight management and regular exercise should be initiated or continued.  [Electronically signed] 01/03/2017 09:34 AM  Baird Lyons MD, ABSM Diplomate, American Board of Sleep Medicine   NPI: [id number]  [place],  American Board of Sleep Medicine  ELECTRONICALLY SIGNED ON:  01/03/2017, 9:30 AM [place] PH: [phone]   FX: [phone]  [phone] [place]

## 2017-01-05 ENCOUNTER — Encounter (HOSPITAL_COMMUNITY): Payer: Self-pay | Admitting: Cardiology

## 2017-01-05 ENCOUNTER — Ambulatory Visit
Admission: RE | Admit: 2017-01-05 | Discharge: 2017-01-05 | Disposition: A | Discharge status: Home / Self Care | Payer: Medicare Other | Source: Ambulatory Visit | Attending: Cardiology | Admitting: Cardiology

## 2017-01-05 ENCOUNTER — Other Ambulatory Visit: Payer: Self-pay | Admitting: Cardiology

## 2017-01-05 DIAGNOSIS — I4819 Other persistent atrial fibrillation: Secondary | ICD-10-CM

## 2017-01-05 DIAGNOSIS — I499 Cardiac arrhythmia, unspecified: Secondary | ICD-10-CM

## 2017-01-05 DIAGNOSIS — R0681 Apnea, not elsewhere classified: Secondary | ICD-10-CM

## 2017-01-05 DIAGNOSIS — I482 Chronic atrial fibrillation, unspecified: Secondary | ICD-10-CM | POA: Diagnosis present

## 2017-01-05 DIAGNOSIS — I5022 Chronic systolic (congestive) heart failure: Secondary | ICD-10-CM

## 2017-01-05 DIAGNOSIS — Z7901 Long term (current) use of anticoagulants: Secondary | ICD-10-CM

## 2017-01-05 HISTORY — DX: Other persistent atrial fibrillation: I48.19

## 2017-01-05 HISTORY — DX: Chronic systolic (congestive) heart failure: I50.22

## 2017-01-05 NOTE — H&P (Signed)
[place] Constable    Date of visit:  01/05/2017 DOB:  Nov 16, 1955    Age:  60 yrs. Medical record number:  [id number]     Account number:  [id number] Primary Care Provider: Windle Guard ____________________________ CURRENT DIAGNOSES  1. Persistent atrial fibrillation  2. Chronic systolic heart failure  3. Dyspnea  4. Cardiomyopathy  5. Long term (current) use of anticoagulants  6. Obesity  7. Sleep apnea ____________________________ ALLERGIES  NKDA ____________________________ MEDICATIONS  1. amiodarone 200 mg tablet, BID  2. diltiazem CD 120 mg capsule,extended release 24 hr, 1 p.o. daily  3. Eliquis 5 mg tablet, BID  4. lisinopril 2.5 mg tablet, 1 p.o. daily  5. magnesium 250 mg tablet, 1 p.o. daily  6. meloxicam 7.5 mg tablet, PRN  7. metoprolol succinate ER 50 mg tablet,extended release 24 hr, take one and half tablets twice a day  8. multivitamin tablet, 1 p.o. daily ____________________________ HISTORY OF PRESENT ILLNESS Patient returns for cardiac followup. He had the recent onset of atrial fibrillation and dyspnea with exertion. He was found to be in atrial fibrillation in February and had somewhat rapid ventricular response. He has untreated sleep apnea and had a recent sleep study showing severe obstructive sleep apnea that persists. He has had previous surgery for sleep apnea. He was treated initially with metoprolol and was placed on Eliquis 5 mg twice daily. An echocardiogram on February 23 showed an ejection fraction of 30% with moderate LVH and moderate to marked left atrial enlargement. He also had left axis deviation and right bundle branch block. He was then started on amiodarone on March 16 and returned to the office today for followup but we remained in atrial fibrillation. His dyspnea is improved with control of his atrial fibrillation rate. He was thought to possibly have an element of tachycardia-induced cardiomyopathy and is brought in at this time for cardioversion to  see if it can help an improvement in his LV function. His breathing has improved. He denies PND orthopnea or claudication. He has not missed any doses of his anticoagulation. ____________________________ PAST HISTORY  Past Medical Illnesses:  sleep apnea, hiatal hernia, obesity, denies hypertension or diabetes;  Cardiovascular Illnesses:  atrial fibrillation, cardiomyopathy(idiopathic);  Surgical Procedures:  appendectomy, inguinal hernia, uvulopalatopharyngoplasty Dr. Haroldine Laws 2013;  NYHA Classification:  II;  Canadian Angina Classification:  Class 0: Asymptomatic;  Cardiology Procedures-Invasive:  no history of prior cardiac procedures;  Cardiology Procedures-Noninvasive:  echocardiogram February 2018;  LVEF of 30% documented via echocardiogram on 11/28/2016,   ____________________________ CARDIO-PULMONARY TEST DATES EKG Date:  01/05/2017;  Echocardiography Date: 11/28/2016;  Chest Xray Date: 05/22/2016;   ____________________________ FAMILY HISTORY Brother -- Brother alive and well Brother -- Brother alive and well Father -- Neoplasm of kidney, Father dead Mother -- Mother dead, Heart disease ____________________________ SOCIAL HISTORY Alcohol Use:  no alcohol use;  Smoking:  used to smoke but quit 2007;  Diet:  vegetarian;  Lifestyle:  divorced and remarried;  Exercise:  some exercise;  Occupation:  retired, Probation officer;  Residence:  lives with wife;   ____________________________ REVIEW OF SYSTEMS General:  no change in weight Eyes: cataract extraction, floaters Ears, Nose, Throat, Mouth:  pulsatile tinnitis Respiratory: mild dyspnea with exertion, snoring Cardiovascular:  please review HPI Abdominal: history of GERD Genitourinary-Male: hesitancy  Musculoskeletal:  chronic back pain, arthritis of the hands, arthritis of the knees Neurological:  denies headaches, stroke, or TIA  ____________________________ PHYSICAL EXAMINATION VITAL SIGNS  Blood Pressure:  110/70 Sitting, Right arm,  large  cuff  , 100/80 Standing, Right arm and large cuff   Pulse:  80/min. Weight:  247.00 lbs. Height:  72.00"BMI: 33  Constitutional:  pleasant white male in no acute distress, moderately obese Skin:  tatoo on left arm Head:  normocephalic, normal hair pattern, no masses or tenderness Chest:  normal symmetry, clear to auscultation and percussion. Cardiac:  irregularly irregular rhythm, normal S1 and S2, no S3 or S4 Abdomen:  abdomen soft,non-tender, no masses, no hepatospenomegaly, or aneurysm noted Peripheral Pulses:  the femoral,dorsalis pedis, and posterior tibial pulses are full and equal bilaterally with no bruits auscultated. Extremities & Back:  no deformities, clubbing, cyanosis, erythema or edema observed. Normal muscle strength and tone. Neurological:  no gross motor or sensory deficits noted, affect appropriate, oriented x3. ____________________________ IMPRESSIONS/PLAN  1. Persistent atrial fibrillation 2. 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 Long-term use of anticoagulation without complications 4. Hypertensive heart disease 5. Severe sleep apnea  Recommendations:  Brought in for elective cardioversion. Cardioversion discussed with the patient and wife including risks of stroke, arrhythmia, death, or anesthesia risks. The patient understands and is willing to proceed. He will need referral for treatment of sleep apnea. greater than 40 minutes spent including greater than 50% of time face to face. ____________________________ TODAYS ORDERS  1. Comprehensive Metabolic Panel: Today  2. Complete Blood Count: Today  3. 12 Lead EKG: Today  4. Chest X-ray PA/Lat: today  5. Cardioversion Thursday                       ____________________________ Cardiology Physician:  Darden Palmer MD [facility]

## 2017-01-08 ENCOUNTER — Ambulatory Visit (HOSPITAL_COMMUNITY): Payer: Medicare Other | Admitting: Anesthesiology

## 2017-01-08 ENCOUNTER — Ambulatory Visit (HOSPITAL_COMMUNITY)
Admission: RE | Admit: 2017-01-08 | Discharge: 2017-01-08 | Disposition: A | Discharge status: Home / Self Care | Payer: Medicare Other | Source: Ambulatory Visit | Attending: Cardiology | Admitting: Cardiology

## 2017-01-08 ENCOUNTER — Encounter (HOSPITAL_COMMUNITY): Payer: Self-pay | Admitting: *Deleted

## 2017-01-08 ENCOUNTER — Encounter (HOSPITAL_COMMUNITY)
Admission: RE | Disposition: A | Discharge status: Home / Self Care | Payer: Self-pay | Source: Ambulatory Visit | Attending: Cardiology

## 2017-01-08 DIAGNOSIS — I481 Persistent atrial fibrillation: Secondary | ICD-10-CM | POA: Insufficient documentation

## 2017-01-08 DIAGNOSIS — E669 Obesity, unspecified: Secondary | ICD-10-CM | POA: Insufficient documentation

## 2017-01-08 DIAGNOSIS — K219 Gastro-esophageal reflux disease without esophagitis: Secondary | ICD-10-CM | POA: Diagnosis not present

## 2017-01-08 DIAGNOSIS — I429 Cardiomyopathy, unspecified: Secondary | ICD-10-CM | POA: Diagnosis not present

## 2017-01-08 DIAGNOSIS — Z87891 Personal history of nicotine dependence: Secondary | ICD-10-CM | POA: Diagnosis not present

## 2017-01-08 DIAGNOSIS — I4510 Unspecified right bundle-branch block: Secondary | ICD-10-CM | POA: Insufficient documentation

## 2017-01-08 DIAGNOSIS — Z6833 Body mass index (BMI) 33.0-33.9, adult: Secondary | ICD-10-CM | POA: Insufficient documentation

## 2017-01-08 DIAGNOSIS — I5022 Chronic systolic (congestive) heart failure: Secondary | ICD-10-CM | POA: Insufficient documentation

## 2017-01-08 DIAGNOSIS — Z8249 Family history of ischemic heart disease and other diseases of the circulatory system: Secondary | ICD-10-CM | POA: Insufficient documentation

## 2017-01-08 DIAGNOSIS — I110 Hypertensive heart disease with heart failure: Secondary | ICD-10-CM | POA: Insufficient documentation

## 2017-01-08 DIAGNOSIS — G4733 Obstructive sleep apnea (adult) (pediatric): Secondary | ICD-10-CM | POA: Diagnosis not present

## 2017-01-08 DIAGNOSIS — I482 Chronic atrial fibrillation, unspecified: Secondary | ICD-10-CM | POA: Diagnosis present

## 2017-01-08 DIAGNOSIS — Z7901 Long term (current) use of anticoagulants: Secondary | ICD-10-CM | POA: Insufficient documentation

## 2017-01-08 HISTORY — DX: Calculus of kidney: N20.0

## 2017-01-08 HISTORY — DX: Chronic systolic (congestive) heart failure: I50.22

## 2017-01-08 HISTORY — PX: CARDIOVERSION: SHX1299

## 2017-01-08 HISTORY — DX: Other persistent atrial fibrillation: I48.19

## 2017-01-08 SURGERY — CARDIOVERSION
Anesthesia: General

## 2017-01-08 MED ORDER — SODIUM CHLORIDE 0.9 % IV SOLN
INTRAVENOUS | Status: DC
  Administered 2017-01-08 (×2): via INTRAVENOUS

## 2017-01-08 MED ORDER — HYDROCORTISONE 1 % EX CREA: 1.0000 "application " | TOPICAL_CREAM | Freq: Three times a day (TID) | CUTANEOUS | Status: DC | PRN

## 2017-01-08 MED ORDER — LIDOCAINE HCL (CARDIAC) 20 MG/ML IV SOLN
INTRAVENOUS | Status: DC | PRN
  Administered 2017-01-08: 60 mg via INTRAVENOUS

## 2017-01-08 MED ORDER — METOPROLOL SUCCINATE ER 50 MG PO TB24: 50.0000 mg | ORAL_TABLET | Freq: Every day | ORAL | 12 refills | Status: DC

## 2017-01-08 MED ORDER — PROPOFOL 10 MG/ML IV BOLUS
INTRAVENOUS | Status: DC | PRN
  Administered 2017-01-08: 70 mg via INTRAVENOUS

## 2017-01-08 NOTE — Progress Notes (Signed)
H&P updated.  No change in condition since last seen.  Darden Palmer. MD [facility]. 1:17 PM 01/08/2017

## 2017-01-08 NOTE — Anesthesia Preprocedure Evaluation (Signed)
Anesthesia Evaluation  Patient identified by MRN, date of birth, ID band Patient awake    Reviewed: Allergy & Precautions, H&P , NPO status , Patient's Chart, lab work & pertinent test results  Airway Mallampati: II   Neck ROM: full    Dental   Pulmonary shortness of breath, sleep apnea , former smoker,    breath sounds clear to auscultation       Cardiovascular + dysrhythmias Atrial Fibrillation  Rhythm:irregular Rate:Normal     Neuro/Psych    GI/Hepatic hiatal hernia, GERD  ,  Endo/Other    Renal/GU stones     Musculoskeletal   Abdominal   Peds  Hematology   Anesthesia Other Findings   Reproductive/Obstetrics                             Anesthesia Physical Anesthesia Plan  ASA: III  Anesthesia Plan: General   Post-op Pain Management:    Induction: Intravenous  Airway Management Planned: Mask  Additional Equipment:   Intra-op Plan:   Post-operative Plan:   Informed Consent: I have reviewed the patients History and Physical, chart, labs and discussed the procedure including the risks, benefits and alternatives for the proposed anesthesia with the patient or authorized representative who has indicated his/her understanding and acceptance.     Plan Discussed with: CRNA, Anesthesiologist and Surgeon  Anesthesia Plan Comments:         Anesthesia Quick Evaluation

## 2017-01-08 NOTE — Transfer of Care (Signed)
Immediate Anesthesia Transfer of Care Note  Patient: Charles Kent.  Procedure(s) Performed: Procedure(s): CARDIOVERSION (N/A)  Patient Location: PACU and Endoscopy Unit  Anesthesia Type:General  Level of Consciousness: awake, alert  and oriented  Airway & Oxygen Therapy: Patient Spontanous Breathing and Patient connected to nasal cannula oxygen  Post-op Assessment: Report given to RN and Post -op Vital signs reviewed and stable  Post vital signs: Reviewed and stable  Last Vitals:  Vitals:   01/08/17 1327 01/08/17 1328  BP:    Pulse: (!) 37 (!) 38  Resp: 17 15  Temp:      Last Pain:  Vitals:   01/08/17 1150  TempSrc: Oral         Complications: No apparent anesthesia complications

## 2017-01-08 NOTE — CV Procedure (Signed)
Electrical Cardioversion Procedure Note  Charles Kent.   61 y.o. male MRN: [id number] DOB: 1956-01-20  Today's date: 01/08/2017  Procedure: Electrical Cardioversion  Indications:  Atrial Fibrillation  Time Out: Verified patient identification, verified procedure,medications/allergies/relevent history reviewed, required imaging and test results available.  Performed  Procedure Details  The patient was NPO after midnight. Anesthesia was administered at the beside  by Dr.Hodierne with 6o mg of lidocaine and 70 mg of propofol.  Cardioversion was done with synchronized biphasic defibrillation with AP pads with 120 watts and then 200 watts with reversion to sinus rhythm after the second shock. The patient tolerated the procedure well   IMPRESSION:  Successful cardioversion of atrial fibrillation    Charles Kent, Charles Kent. MD [facility]   01/08/2017, 1:27 PM

## 2017-01-08 NOTE — Discharge Instructions (Signed)
Electrical Cardioversion, Care After °This sheet gives you information about how to care for yourself after your procedure. Your health care provider may also give you more specific instructions. If you have problems or questions, contact your health care provider. °What can I expect after the procedure? °After the procedure, it is common to have: °· Some redness on the skin where the shocks were given. °Follow these instructions at home: °· Do not drive for 24 hours if you were given a medicine to help you relax (sedative). °· Take over-the-counter and prescription medicines only as told by your health care provider. °· Ask your health care provider how to check your pulse. Check it often. °· Rest for 48 hours after the procedure or as told by your health care provider. °· Avoid or limit your caffeine use as told by your health care provider. °Contact a health care provider if: °· You feel like your heart is beating too quickly or your pulse is not regular. °· You have a serious muscle cramp that does not go away. °Get help right away if: °· You have discomfort in your chest. °· You are dizzy or you feel faint. °· You have trouble breathing or you are short of breath. °· Your speech is slurred. °· You have trouble moving an arm or leg on one side of your body. °· Your fingers or toes turn cold or blue. °This information is not intended to replace advice given to you by your health care provider. Make sure you discuss any questions you have with your health care provider. °Document Released: 07/13/2013 Document Revised: 04/25/2016 Document Reviewed: 03/28/2016 °Elsevier Interactive Patient Education © 2017 Elsevier Inc. ° °

## 2017-01-08 NOTE — Anesthesia Procedure Notes (Signed)
Date/Time: 01/08/2017 1:15 PM Performed by: Gwenyth Allegra Pre-anesthesia Checklist: Patient identified, Emergency Drugs available, Suction available, Patient being monitored and Timeout performed Patient Re-evaluated:Patient Re-evaluated prior to inductionOxygen Delivery Method: Ambu bag Preoxygenation: Pre-oxygenation with 100% oxygen Intubation Type: IV induction

## 2017-01-09 ENCOUNTER — Telehealth: Payer: Self-pay | Admitting: Internal Medicine

## 2017-01-09 DIAGNOSIS — G4730 Sleep apnea, unspecified: Secondary | ICD-10-CM

## 2017-01-09 NOTE — Anesthesia Postprocedure Evaluation (Signed)
Anesthesia Post Note  Patient: Charles Kent.  Procedure(s) Performed: Procedure(s) (LRB): CARDIOVERSION (N/A)  Patient location during evaluation: PACU Anesthesia Type: General Level of consciousness: awake and alert and patient cooperative Pain management: pain level controlled Vital Signs Assessment: post-procedure vital signs reviewed and stable Respiratory status: spontaneous breathing and respiratory function stable Cardiovascular status: stable Anesthetic complications: no        Last Vitals:  Vitals:   01/08/17 1405 01/08/17 1415  BP: (!) 100/59 108/69  Pulse: (!) 42 (!) 45  Resp: 15 18  Temp:      Last Pain:  Vitals:   01/08/17 1330  TempSrc: Oral   Pain Goal:                 Jodeen Mclin S

## 2017-01-09 NOTE — Telephone Encounter (Signed)
Spoke with the pt's spouse  She states pt had PSG done 3/22 ordered by his PCP  They were advised that he needs CPAP asap, but wanted [place] to order  She is req appt with MW for this  None of the sleep docs have any appts available to consults this month  MW- are you okay with seeing him for this?  Please advise thanks!

## 2017-01-10 NOTE — Telephone Encounter (Signed)
Since it was a Dr Maple Hudson study yes ok to order:  - Trial of BiPAP therapy on 19/15 cm H2O with a Medium size Fisher&Paykel Full Face Mask Simplus mask and heated humidification. - Be careful with alcohol, sedatives and other CNS depressants that may worsen sleep apnea and disrupt normal sleep architecture  Needs new pt sleep consult with one of our sleep specialists

## 2017-01-12 NOTE — Telephone Encounter (Signed)
Called and spoke to pt's wife and informed her of the results and recs per MW. She verbalized understanding. Order placed and appt with RA was made for 03/13/2017. Pt's wife verbalized understanding and denied any further questions or concerns at this time.

## 2017-03-03 ENCOUNTER — Encounter: Payer: Self-pay | Admitting: Internal Medicine

## 2017-03-13 ENCOUNTER — Institutional Professional Consult (permissible substitution): Payer: Medicare Other | Admitting: Pulmonary Disease

## 2017-06-01 ENCOUNTER — Encounter: Payer: Self-pay | Admitting: Cardiology

## 2017-06-01 NOTE — Progress Notes (Signed)
[place] Constable    Date of visit:  06/01/2017 DOB:  11/15/1955    Age:  61 yrs. Medical record number:  [id number]     Account number:  [id number] Primary Care Provider: Windle Guard ____________________________ CURRENT DIAGNOSES  1. Persistent atrial fibrillation  2. Chronic systolic heart failure  3. Dyspnea  4. Cardiomyopathy  5. Long term (current) use of anticoagulants  6. Obesity  7. Sleep apnea ____________________________ ALLERGIES  NKDA ____________________________ MEDICATIONS  1. amiodarone 200 mg tablet, 1 p.o. daily  2. doxycycline hyclate 100 mg tablet, BID  3. Eliquis 5 mg tablet, BID  4. lisinopril 2.5 mg tablet, 1 p.o. daily  5. magnesium 250 mg tablet, 1 p.o. daily  6. meloxicam 7.5 mg tablet, PRN  7. metoprolol succinate ER 50 mg tablet,extended release 24 hr, BID  8. multivitamin tablet, 1 p.o. daily ____________________________ CHIEF COMPLAINTS  Followup of Persistent atrial fibrillation ____________________________ HISTORY OF PRESENT ILLNESS   Patient returns for cardiac followup. He is not feeling as well as he did after the cardioversion. He had a followup echocardiogram that showed improvement in his EF of 30% to 40% but with significant LVH. He has gone back into atrial fibrillation now however. He has been compliant with his medicines and has been seeking treatment for his sleep apnea. He previously has had head and neck surgery for this but he also has been seen at [facility] to talk about an implant and is even considering a dental device. He is actively pursuing treatment for his sleep apnea. He has not been able to lose any significant weight. He denies PND orthopnea or edema. No bleeding complications from anticoagulation. ____________________________ PAST HISTORY  Past Medical Illnesses:  sleep apnea, hiatal hernia, obesity, denies hypertension or diabetes;  Cardiovascular Illnesses:  atrial fibrillation, cardiomyopathy(idiopathic);  Surgical Procedures:   appendectomy, inguinal hernia, uvulopalatopharyngoplasty Dr. Haroldine Laws 2013;  NYHA Classification:  II;  Canadian Angina Classification:  Class 0: Asymptomatic;  Cardiology Procedures-Invasive:  cardioversion April 2018;  Cardiology Procedures-Noninvasive:  echocardiogram February 2018;  LVEF of 30% documented via echocardiogram on 11/28/2016,   CHA2DS2-VASC Score:  2 ____________________________ CARDIO-PULMONARY TEST DATES EKG Date:  06/01/2017;  Echocardiography Date: 11/28/2016;  Chest Xray Date: 01/05/2017;   ____________________________ FAMILY HISTORY Brother -- Brother alive and well Brother -- Brother alive and well Father -- Neoplasm of kidney, Father dead Mother -- Mother dead, Heart disease ____________________________ SOCIAL HISTORY Alcohol Use:  no alcohol use;  Smoking:  used to smoke but quit 2007;  Diet:  vegetarian;  Lifestyle:  divorced and remarried;  Exercise:  some exercise;  Occupation:  retired, Probation officer;  Residence:  lives with wife;   ____________________________ REVIEW OF SYSTEMS General:  no change in weight Eyes: cataract extraction, floaters Ears, Nose, Throat, Mouth:  pulsatile tinnitis Respiratory: mild dyspnea with exertion Cardiovascular:  please review HPI Abdominal: history of GERD Genitourinary-Male: hesitancy  Musculoskeletal:  chronic back pain, arthritis of the hands, arthritis of the knees Neurological:  denies headaches, stroke, or TIA  ____________________________ PHYSICAL EXAMINATION VITAL SIGNS  Blood Pressure:  136/94 Sitting, Left arm, large cuff  , 114/90 Standing, Left arm and large cuff   Pulse:  64/min. Weight:  244.00 lbs. Height:  72.00"BMI: 33  Constitutional:  pleasant white male in no acute distress, moderately obese Skin:  tatoo on left arm Head:  normocephalic, normal hair pattern, no masses or tenderness Chest:  normal symmetry, clear to auscultation. Cardiac: irregular rhythm, normal S1 and S2, no S3 or S4 Peripheral  Pulses:   the femoral,dorsalis pedis, and posterior tibial pulses are full and equal bilaterally with no bruits auscultated. Extremities & Back:  no deformities, clubbing, cyanosis, erythema or edema observed. Normal muscle strength and tone. Neurological:  no gross motor or sensory deficits noted, affect appropriate, oriented x3. ___________________________ IMPRESSIONS/PLAN  1. Recurrent persistent atrial fibrillation despite amiodarone and previous cardioversion 2. Hypertensive heart disease with hypertrophic cardiomyopathy with EF of 40% now 3. Long term use of anticoagulation 4. Sleep apnea 5. Obesity  Recommendations:  At this point he still has a depressed EF and has evidence of chronic systolic heart failure. He is minimally symptomatic however from his depressed EF. In light of recent clinical trials I would like to see electrophysiologist to see if he would be a candidate for ablation. He has moderate left atrial enlargement on his echocardiogram. Followup after he has a chance to see the electrophysiologist. Twelve-lead EKG personally reviewed by me shows atrial fibrillation with controlled response, left axis deviation and right bundle branch block. He has never had any chest pain and we were going to do an ischemic workup down the road depending on what his response of his ventricle was to restoration of sinus rhythm. ____________________________ TODAYS ORDERS  1. Comprehensive Metabolic Panel: Today  2. TSH: Today  3. 12 Lead EKG: Today  4. Return Visit: 3 months                       ____________________________ Cardiology Physician:  Darden Palmer MD [facility]

## 2017-06-09 ENCOUNTER — Other Ambulatory Visit: Payer: Self-pay | Admitting: Internal Medicine

## 2017-06-26 ENCOUNTER — Ambulatory Visit (INDEPENDENT_AMBULATORY_CARE_PROVIDER_SITE_OTHER): Payer: Medicare Other | Admitting: Internal Medicine

## 2017-06-26 ENCOUNTER — Encounter: Payer: Self-pay | Admitting: Internal Medicine

## 2017-06-26 ENCOUNTER — Other Ambulatory Visit: Payer: Self-pay | Admitting: *Deleted

## 2017-06-26 VITALS — BP 128/88 | HR 75 | Ht 72.0 in | Wt 241.2 lb

## 2017-06-26 DIAGNOSIS — I481 Persistent atrial fibrillation: Secondary | ICD-10-CM | POA: Diagnosis not present

## 2017-06-26 DIAGNOSIS — I519 Heart disease, unspecified: Secondary | ICD-10-CM

## 2017-06-26 DIAGNOSIS — G4733 Obstructive sleep apnea (adult) (pediatric): Secondary | ICD-10-CM

## 2017-06-26 DIAGNOSIS — I480 Paroxysmal atrial fibrillation: Secondary | ICD-10-CM | POA: Diagnosis not present

## 2017-06-26 DIAGNOSIS — I4819 Other persistent atrial fibrillation: Secondary | ICD-10-CM

## 2017-06-26 NOTE — Addendum Note (Signed)
Addended by: Shelly Bombard on: 06/26/2017 11:22 AM   Modules accepted: Orders

## 2017-06-26 NOTE — Patient Instructions (Addendum)
Medication Instructions:  Your physician recommends that you continue on your current medications as directed. Please refer to the Current Medication list given to you today.   Labwork: Your physician recommends that you return for lab work ---will schedule when you call back with date   Testing/Procedures: Non-Cardiac CT scanning, (CAT scanning), is a noninvasive, special x-ray that produces cross-sectional images of the body using x-rays and a computer. CT scans help physicians diagnose and treat medical conditions. For some CT exams, a contrast material is used to enhance visibility in the area of the body being studied. CT scans provide greater clarity and reveal more details than regular x-ray exams.---needs to be done week prior to the ablation  Your physician has recommended that you have an ablation. Catheter ablation is a medical procedure used to treat some cardiac arrhythmias (irregular heartbeats). During catheter ablation, a long, thin, flexible tube is put into a blood vessel in your groin (upper thigh), or neck. This tube is called an ablation catheter. It is then guided to your heart through the blood vessel. Radio frequency waves destroy small areas of heart tissue where abnormal heartbeats may cause an arrhythmia to start. Please see the instruction sheet given to you today.---available dates 10/9, 10/16, 10/17,10/23, 10/30.  Please call Anselm Pancoast at [phone] to let me know a date that will work    Follow-Up: Your physician recommends that you schedule a follow-up appointment in: 4 weeks from date of procedure in afib clinic and 3 months from date of procedure with Dr Johney Frame   Thank you for choosing [facility]!!     Dennis Bast, RN [phone]

## 2017-06-26 NOTE — Addendum Note (Signed)
Addended by: Dennis Bast F on: 06/26/2017 01:01 PM   Modules accepted: Orders

## 2017-06-26 NOTE — Progress Notes (Signed)
Electrophysiology Office Note   Date:  06/26/2017   ID:  Charles Musto., DOB 06/12/1956, MRN [id number]  PCP:  Charles Mask, MD  Cardiologist:  Dr Charles Kent Primary Electrophysiologist: Charles Range, MD    CC: afib   History of Present Illness: Charles Wison. is a 61 y.o. male who presents today for electrophysiology evaluation.   The patient is referred by Dr Charles Kent for further evaluation and management of atrial fibrillation.  The patient was diagnosed with atrial fibrillation 2/18 after presenting to Dr Charles Kent for routine exam and having afib diagnosed.  In retrospect, he thinks that he has been in afib for 2-3 years.  He reports symptoms of fatigue and decreased exercise tolerance.  He has been treated with amiodarone and has been cardioverted but did not maintain sinus rhythm.  He did feel "better" early after cardioversion.  He thinks he returned to afib after a month as his symptoms returned.  He has OSA but has not been able to use CPAP.  He is followed at [facility] and is considering dental appliance.  He has an EF of 40% with moderate LVH and moderate atrial enlargement.  He is referred by Dr Charles Kent to consider other options.  Today, he denies symptoms of palpitations, chest pain, shortness of breath, orthopnea, PND, lower extremity edema, claudication, dizziness, presyncope, syncope, bleeding, or neurologic sequela. The patient is tolerating medications without difficulties and is otherwise without complaint today.    Past Medical History:  Diagnosis Date  . Anticoagulant long-term use   . Cardiomyopathy (HCC)   . 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 Dyspnea   . GERD (gastroesophageal reflux disease)   . H/O hiatal hernia   . Obesity   . Persistent atrial fibrillation (HCC) 01/05/2017   CHA2DS2VASC score 2 Diagnosis February 2018  . Renal stones   . Sleep apnea    Past  Surgical History:  Procedure Laterality Date  . APPENDECTOMY    . CARDIOVERSION N/A 01/08/2017   Procedure: CARDIOVERSION;  Surgeon: Charles Boyer, MD;  Location: [facility] ENDOSCOPY;  Service: Cardiovascular;  Laterality: N/A;  . EYE SURGERY     Cataract right with Lens  . HERNIA REPAIR     Right x 2 2004, 2005  . NASAL SEPTOPLASTY W/ TURBINOPLASTY  12/24/2011   Procedure: NASAL SEPTOPLASTY WITH TURBINATE REDUCTION;  Surgeon: Charles Barre, MD;  Location: [facility] OR;  Service: ENT;  Laterality: Bilateral;  . UVULOPALATOPHARYNGOPLASTY  12/24/2011   Procedure: UVULOPALATOPHARYNGOPLASTY (UPPP);  Surgeon: Charles Barre, MD;  Location: [facility] OR;  Service: ENT;  Laterality: Bilateral;  SEPTAL RECONSTRUCTION ANB BILATERIAL TURBINATE REDUCTION     Current Outpatient Prescriptions  Medication Sig Dispense Refill  . amiodarone (PACERONE) 200 MG tablet Take by mouth.    Marland Kitchen apixaban (ELIQUIS) 5 MG TABS tablet Take by mouth.    Marland Kitchen lisinopril (PRINIVIL,ZESTRIL) 2.5 MG tablet Take by mouth.    . Magnesium 250 MG TABS Take 250 mg by mouth daily.    . meloxicam (MOBIC) 7.5 MG tablet Take by mouth.    . metoprolol succinate (TOPROL-XL) 50 MG 24 hr tablet Take 1 tablet (50 mg total) by mouth daily. Take with or immediately following a meal. 30 tablet 12  . Multiple Vitamin (MULTIVITAMIN WITH MINERALS) TABS tablet Take 1 tablet by mouth daily.    Marland Kitchen omeprazole (PRILOSEC) 20 MG capsule Take 20 mg by mouth  daily.    . pantoprazole (PROTONIX) 40 MG tablet TAKE 1 TABLET BY MOUTH DAILY 30-60 MIN BEFORE FIRST MEAL OF THE DAY 30 tablet 0  . sulfamethoxazole-trimethoprim (BACTRIM DS,SEPTRA DS) 800-160 MG tablet TAKE 1 TABLET BYMOUTH EVERY 12 HOURS FOR URINE  0  . tamsulosin (FLOMAX) 0.4 MG CAPS capsule Take by mouth.     No current facility-administered medications for this visit.     Allergies:   Patient has no known allergies.   Social History:  The patient  reports that he quit smoking about 12 years ago. His smoking  use included Cigarettes. He has a 32.00 pack-year smoking history. He has never used smokeless tobacco. He reports that he does not drink alcohol or use drugs.   Family History:  The patient's  family history includes Cancer - Other in his father; Heart disease in his mother.    ROS:  Please see the history of present illness.   All other systems are personally reviewed and negative.    PHYSICAL EXAM: VS:  BP 128/88   Pulse 75   Ht 6' (1.829 m)   Wt 241 lb 3.2 oz (109.4 kg)   BMI 32.71 kg/m  , BMI Body mass index is 32.71 kg/m.   GEN: overweight, in no acute distress  HEENT: normal  Neck: no JVD, carotid bruits, or masses Cardiac: iRRR; no murmurs, rubs, or gallops,no edema  Respiratory:  clear to auscultation bilaterally, normal work of breathing GI: soft, nontender, nondistended, + BS MS: no deformity or atrophy  Skin: warm and dry  Neuro:  Strength and sensation are intact Psych: euthymic mood, full affect  EKG:  EKG is ordered today. The ekg ordered today is personally reviewed and shows afib, V rate 75 bpm, RBBB/LAHB, QTc 536 msec  Lipid Panel  No results found for: CHOL, TRIG, HDL, CHOLHDL, VLDL, LDLCALC, LDLDIRECT personally reviewed   Wt Readings from Last 3 Encounters:  06/26/17 241 lb 3.2 oz (109.4 kg)  01/08/17 240 lb (108.9 kg)  12/23/16 240 lb (108.9 kg)      Other studies personally reviewed: Additional studies/ records that were reviewed today include: Dr Charles Kent office notes  Review of the above records today demonstrates: as above   ASSESSMENT AND PLAN:  1.  Persistent atrial fibrillation The patient has symptomatic persistent atrial fibrillation.  He has failed medical therapy with amiodarone. Therapeutic strategies for afib including medicine and ablation were discussed in detail with the patient today. Risk, benefits, and alternatives to EP study and radiofrequency ablation for afib were also discussed in detail today. These risks include but are  not limited to stroke, bleeding, vascular damage, tamponade, perforation, damage to the esophagus, lungs, and other structures, pulmonary vein stenosis, worsening renal function, and death. The patient understands these risk and wishes to proceed.  We will therefore proceed with catheter ablation at the next available time.  Will plan Cardiac CT prior to ablation to exclude LAA thrombus. Lifestyle modification was discussed at length  2. OSA Importance of compliance with treatment discussed He will follow-up at [facility] for options as he cannot tolerate CPAP  3. Obesity Body mass index is 32.71 kg/m. Weight loss advised  4. Chronic systolic dysfunction Felt to likely be tachycardia mediated in etiology [place] AF study results discussed with the patient today.  There is anticipated benefit with ablation given reduced EF and persistent afib. Dr Charles Kent to continue to optimize medical therapy Cardiac CT to evaluate cors prior to afib ablation  Current medicines are reviewed at length with the patient today.   The patient does not have concerns regarding his medicines.  The following changes were made today:  none    Signed, Charles Range, MD  06/26/2017 9:42 AM     [facility] [address] [place] [address] [phone] (office) [phone] (fax)

## 2017-07-07 ENCOUNTER — Telehealth: Payer: Self-pay | Admitting: Internal Medicine

## 2017-07-07 NOTE — Telephone Encounter (Signed)
New message  Pt verbalized that he is calling for the rn   To schedule a procedure - ablation for A-Fib

## 2017-07-08 NOTE — Telephone Encounter (Signed)
New Message   pt verbalized that he is returning call for rn   To schedule procedure

## 2017-07-08 NOTE — Telephone Encounter (Signed)
Your physician has requested that you have cardiac CT. Cardiac computed tomography (CT) is a painless test that uses an x-ray machine to take clear, detailed pictures of your heart. For further information please visit [email or web address] Please follow instruction sheet as given.-- needs week of 07/20/17   Your physician has recommended that you have an ablation. Catheter ablation is a medical procedure used to treat some cardiac arrhythmias (irregular heartbeats). During catheter ablation, a long, thin, flexible tube is put into a blood vessel in your groin (upper thigh), or neck. This tube is called an ablation catheter. It is then guided to your heart through the blood vessel. Radio frequency waves destroy small areas of heart tissue where abnormal heartbeats may cause an arrhythmia to start. Please see the instruction sheet given to you today.---07/28/17  Please arrive at [facility] Entrance of [facility] at 7:30am Do not eat or drink after midnight the night prior to the procedure Do not take any medications the morning of the test Plan for one night stay Will need someone to drive you home at discharge

## 2017-07-14 ENCOUNTER — Encounter: Payer: Self-pay | Admitting: Internal Medicine

## 2017-07-15 NOTE — Telephone Encounter (Signed)
Left message for patient to return call in regards to his upcoming ablation and cardiac CT scheduled for 07/1717  Will give instructions when he returns call.    Please arrive at the [facility] main entrance of [facility] on 07/22/17 at 12:45pm (30-45 minutes prior to test start time)  [facility] [address] [place], [place] [address] [phone]  Proceed to the [facility] Radiology Department (First Floor).++ +++++++++++++++ Hold all erectile dysfunction medications at least 48 hours prior to test.  On the Night Before the Test: . Drink plenty of water. . Do not consume any caffeinated/decaffeinated beverages or chocolate 12 hours prior to your test. . Do not take any antihistamines 12 hours prior to your test. . If you take Metformin do not take 24 hours prior to test.   On the Day of the Test: . Drink plenty of water. Do not drink any water within one hour of the test. . Do not eat any food 4 hours prior to the test. . You may take your regular medications prior to the test. . IF NOT ON A BETA BLOCKER - Take 50 mg of lopressor (metoprolol) one hour before the test. . HOLD Furosemide morning of the test.  After the Test: . Drink plenty of water. . After receiving IV contrast, you may experience a mild flushed feeling. This is normal. . On occasion, you may experience a mild rash up to 24 hours after the test. This is not dangerous. If this occurs, you can take Benadryl 25 mg and increase your fluid intake. . If you experience trouble breathing, this can be serious. If it is severe call 911 IMMEDIATELY. If it is mild, please call our office. . If you take any of these medications: Glipizide/Metformin, Avandament, Glucavance, please do not take 48 hours after completing test.

## 2017-07-16 ENCOUNTER — Other Ambulatory Visit: Payer: Self-pay | Admitting: Internal Medicine

## 2017-07-16 ENCOUNTER — Other Ambulatory Visit: Payer: Medicare Other

## 2017-07-21 ENCOUNTER — Other Ambulatory Visit: Payer: Self-pay | Admitting: Internal Medicine

## 2017-07-22 ENCOUNTER — Encounter (HOSPITAL_COMMUNITY): Payer: Self-pay

## 2017-07-22 ENCOUNTER — Ambulatory Visit (HOSPITAL_COMMUNITY)
Admission: RE | Admit: 2017-07-22 | Discharge: 2017-07-22 | Disposition: A | Discharge status: Home / Self Care | Payer: Medicare Other | Source: Ambulatory Visit | Attending: Internal Medicine | Admitting: Internal Medicine

## 2017-07-22 ENCOUNTER — Ambulatory Visit (HOSPITAL_COMMUNITY): Payer: Medicare Other

## 2017-07-22 DIAGNOSIS — I480 Paroxysmal atrial fibrillation: Secondary | ICD-10-CM

## 2017-07-22 MED ORDER — IOPAMIDOL (ISOVUE-370) INJECTION 76%
INTRAVENOUS | Status: AC
  Administered 2017-07-22: 80 mL
  Filled 2017-07-22: qty 100

## 2017-07-22 MED ORDER — METOPROLOL TARTRATE 5 MG/5ML IV SOLN
INTRAVENOUS | Status: AC
  Filled 2017-07-22: qty 5

## 2017-07-22 MED ORDER — METOPROLOL TARTRATE 5 MG/5ML IV SOLN
5.0000 mg | Freq: Once | INTRAVENOUS | Status: AC
  Administered 2017-07-22: 5 mg via INTRAVENOUS
  Filled 2017-07-22: qty 5

## 2017-07-22 NOTE — Progress Notes (Signed)
CT scan completed. Tolerated well. D/C home with wife walking. Awake and alert,. In no distress.

## 2017-07-27 ENCOUNTER — Ambulatory Visit (HOSPITAL_COMMUNITY): Payer: Medicare Other | Admitting: Certified Registered"

## 2017-07-27 ENCOUNTER — Telehealth: Payer: Self-pay | Admitting: Internal Medicine

## 2017-07-27 NOTE — Telephone Encounter (Signed)
Spoke with patient and gave instructions again:  Stressed importance of not missing his Eliquis.  He has not missed any doses  Please arrive at [facility] Entrance of [facility] at 7:30 Do not eat or drink after midnight the night prior to the procedure Do not take any medications the morning of the test Plan for one night stay Will need someone to drive you home at discharge

## 2017-07-27 NOTE — Telephone Encounter (Signed)
New message    Pt is calling to find out instructions before procedure. Please call.

## 2017-07-28 ENCOUNTER — Encounter (HOSPITAL_COMMUNITY)
Admission: RE | Disposition: A | Discharge status: Home / Self Care | Payer: Self-pay | Source: Ambulatory Visit | Attending: Internal Medicine

## 2017-07-28 ENCOUNTER — Ambulatory Visit (HOSPITAL_COMMUNITY)
Admission: RE | Admit: 2017-07-28 | Discharge: 2017-07-28 | Disposition: A | Discharge status: Home / Self Care | Payer: Medicare Other | Source: Ambulatory Visit | Attending: Internal Medicine | Admitting: Internal Medicine

## 2017-07-28 ENCOUNTER — Encounter (HOSPITAL_COMMUNITY): Payer: Self-pay | Admitting: Certified Registered"

## 2017-07-28 DIAGNOSIS — Z7901 Long term (current) use of anticoagulants: Secondary | ICD-10-CM | POA: Insufficient documentation

## 2017-07-28 DIAGNOSIS — I454 Nonspecific intraventricular block: Secondary | ICD-10-CM | POA: Diagnosis not present

## 2017-07-28 DIAGNOSIS — I4891 Unspecified atrial fibrillation: Secondary | ICD-10-CM | POA: Diagnosis present

## 2017-07-28 DIAGNOSIS — Z538 Procedure and treatment not carried out for other reasons: Secondary | ICD-10-CM | POA: Diagnosis not present

## 2017-07-28 LAB — BASIC METABOLIC PANEL
Anion gap: 9 (ref 5–15)
BUN: 14 mg/dL (ref 6–20)
CALCIUM: 8.7 mg/dL — AB (ref 8.9–10.3)
CO2: 26 mmol/L (ref 22–32)
CREATININE: 0.98 mg/dL (ref 0.61–1.24)
Chloride: 106 mmol/L (ref 101–111)
GFR calc Af Amer: >60 mL/min (ref >60)
GLUCOSE: 100 mg/dL — AB (ref 65–99)
POTASSIUM: 3.9 mmol/L (ref 3.5–5.1)
SODIUM: 141 mmol/L (ref 135–145)

## 2017-07-28 LAB — CBC
HCT: 44.3 % (ref 39.0–52.0)
Hemoglobin: 14.8 g/dL (ref 13.0–17.0)
MCH: 33 pg (ref 26.0–34.0)
MCHC: 33.4 g/dL (ref 30.0–36.0)
MCV: 98.7 fL (ref 78.0–100.0)
PLATELETS: 169 10*3/uL (ref 150–400)
RBC: 4.49 MIL/uL (ref 4.22–5.81)
RDW: 14 % (ref 11.5–15.5)
WBC: 4.7 10*3/uL (ref 4.0–10.5)

## 2017-07-28 SURGERY — ATRIAL FIBRILLATION ABLATION
Anesthesia: Monitor Anesthesia Care

## 2017-07-28 MED ORDER — SODIUM CHLORIDE 0.9 % IV SOLN
INTRAVENOUS | Status: DC
  Administered 2017-07-28: 08:00:00 via INTRAVENOUS

## 2017-07-28 MED ORDER — APIXABAN 5 MG PO TABS
5.0000 mg | ORAL_TABLET | Freq: Once | ORAL | Status: AC
  Administered 2017-07-28: 5 mg via ORAL
  Filled 2017-07-28: qty 1

## 2017-07-28 NOTE — Anesthesia Preprocedure Evaluation (Signed)
Anesthesia Evaluation  Patient identified by MRN, date of birth, ID band Patient awake    Reviewed: Allergy & Precautions, NPO status , Patient's Chart, lab work & pertinent test results  Airway Mallampati: I  TM Distance: >3 FB Neck ROM: Full    Dental   Pulmonary sleep apnea , former smoker,    Pulmonary exam normal        Cardiovascular Normal cardiovascular exam     Neuro/Psych    GI/Hepatic GERD  Medicated and Controlled,  Endo/Other    Renal/GU      Musculoskeletal   Abdominal   Peds  Hematology   Anesthesia Other Findings   Reproductive/Obstetrics                             Anesthesia Physical Anesthesia Plan  ASA: III  Anesthesia Plan: General   Post-op Pain Management:    Induction: Intravenous  PONV Risk Score and Plan: 2 and Ondansetron and Treatment may vary due to age or medical condition  Airway Management Planned: LMA  Additional Equipment:   Intra-op Plan:   Post-operative Plan: Extubation in OR  Informed Consent: I have reviewed the patients History and Physical, chart, labs and discussed the procedure including the risks, benefits and alternatives for the proposed anesthesia with the patient or authorized representative who has indicated his/her understanding and acceptance.     Plan Discussed with: CRNA and Surgeon  Anesthesia Plan Comments:         Anesthesia Quick Evaluation

## 2017-07-28 NOTE — H&P (Signed)
Patient presents for afib ablation.  He has been in afib for over a year.  Cardiac CT from last week is reviewed.  Unfortunately, he says that he "forgot" to take his eliquis last night.  He has not had eliquis in 24 hours.  I worry about risks of stroke and therefore will cancel his afib ablation. We will reschedule the procedure for at least 3 weeks from now once he has not missed any doses of eliquis.  TEE will be required on same day as his procedure.  Hillis Range MD, [facility] 07/28/2017 9:41 AM

## 2017-07-30 ENCOUNTER — Other Ambulatory Visit: Payer: Self-pay | Admitting: Internal Medicine

## 2017-07-31 ENCOUNTER — Telehealth: Payer: Self-pay

## 2017-07-31 DIAGNOSIS — I4819 Other persistent atrial fibrillation: Secondary | ICD-10-CM

## 2017-07-31 NOTE — Telephone Encounter (Signed)
Deliah Boston, RN  Sigurd Sos, RN        This is the man I spoke with yesterday and he still missed his dose of Eliquis the night before. Will need to reschedule. Thought you and I can do these that need to be completed Thurs or if you have time tomorrow. I will help you Thurs if you don't get time tomorrow   Attached Notes   H&P by Hillis Range, MD at 07/28/2017 9:40 AM   Author: Hillis Range, MD Service: Electrophysiology Author Type: Physician  Filed: 07/28/2017 9:41 AM Date of Service: 07/28/2017 9:40 AM Note Type: H&P  Status: Signed Editor: Hillis Range, MD (Physician)    Patient presents for afib ablation.  He has been in afib for over a year.  Cardiac CT from last week is reviewed.  Unfortunately, he says that he "forgot" to take his eliquis last night.  He has not had eliquis in 24 hours.  I worry about risks of stroke and therefore will cancel his afib ablation. We will reschedule the procedure for at least 3 weeks from now once he has not missed any doses of eliquis.  TEE will be required on same day as his procedure.  Hillis Range MD, [facility] 07/28/2017 9:41 AM

## 2017-08-03 NOTE — Telephone Encounter (Signed)
Returned call to patient and rescheduled Afib ablation..  See below for instructions.  Your physician has requested that you have a TEE. During a TEE, sound waves are used to create images of your heart. It provides your doctor with information about the size and shape of your heart and how well your heart's chambers and valves are working. In this test, a transducer is attached to the end of a flexible tube that's guided down your throat and into your esophagus (the tube leading from you mouth to your stomach) to get a more detailed image of your heart. You are not awake for the procedure. Please see the instruction sheet given to you today. For further information please visit [email or web address]   Your physician has recommended that you have an ablation. Catheter ablation is a medical procedure used to treat some cardiac arrhythmias (irregular heartbeats). During catheter ablation, a long, thin, flexible tube is put into a blood vessel in your groin (upper thigh), or neck. This tube is called an ablation catheter. It is then guided to your heart through the blood vessel. Radio frequency waves destroy small areas of heart tissue where abnormal heartbeats may cause an arrhythmia to start. Please see the instruction sheet given to you today.--08/28/17  Please arrive at [facility] Entrance of [facility] at 6:30 am Do not eat or drink after midnight the night prior to the procedure Do not miss any doses of Eliquis prior to the procedure except for:  Hold morning of. Do not take any medications the morning of the test Plan for one night stay Will need someone to drive you home at discharge

## 2017-08-06 ENCOUNTER — Other Ambulatory Visit: Payer: Self-pay | Admitting: Internal Medicine

## 2017-08-18 ENCOUNTER — Telehealth: Payer: Self-pay

## 2017-08-18 NOTE — Telephone Encounter (Signed)
LPM reminding him to pick up paper work for his procedure when he arrives tomorrow for Lab draw.

## 2017-08-19 ENCOUNTER — Other Ambulatory Visit: Payer: Medicare Other | Admitting: *Deleted

## 2017-08-19 DIAGNOSIS — I4819 Other persistent atrial fibrillation: Secondary | ICD-10-CM

## 2017-08-19 LAB — CBC WITH DIFFERENTIAL/PLATELET
BASOS ABS: 0 10*3/uL (ref 0.0–0.2)
BASOS: 1 %
EOS (ABSOLUTE): 0.1 10*3/uL (ref 0.0–0.4)
EOS: 2 %
HEMATOCRIT: 46 % (ref 37.5–51.0)
HEMOGLOBIN: 16 g/dL (ref 13.0–17.7)
IMMATURE GRANS (ABS): 0 10*3/uL (ref 0.0–0.1)
Immature Granulocytes: 0 %
LYMPHS ABS: 1.2 10*3/uL (ref 0.7–3.1)
LYMPHS: 23 %
MCH: 33.7 pg — AB (ref 26.6–33.0)
MCHC: 34.8 g/dL (ref 31.5–35.7)
MCV: 97 fL (ref 79–97)
MONOCYTES: 13 %
Monocytes Absolute: 0.6 10*3/uL (ref 0.1–0.9)
NEUTROS ABS: 3.1 10*3/uL (ref 1.4–7.0)
Neutrophils: 61 %
Platelets: 182 10*3/uL (ref 150–379)
RBC: 4.75 x10E6/uL (ref 4.14–5.80)
RDW: 14.5 % (ref 12.3–15.4)
WBC: 5.1 10*3/uL (ref 3.4–10.8)

## 2017-08-19 LAB — BASIC METABOLIC PANEL
BUN / CREAT RATIO: 11 (ref 10–24)
BUN: 12 mg/dL (ref 8–27)
CO2: 26 mmol/L (ref 20–29)
CREATININE: 1.1 mg/dL (ref 0.76–1.27)
Calcium: 9.3 mg/dL (ref 8.6–10.2)
Chloride: 101 mmol/L (ref 96–106)
GFR calc Af Amer: 82 mL/min/{1.73_m2} (ref >59)
GFR, EST NON AFRICAN AMERICAN: 71 mL/min/{1.73_m2} (ref >59)
GLUCOSE: 97 mg/dL (ref 65–99)
Potassium: 4.7 mmol/L (ref 3.5–5.2)
SODIUM: 142 mmol/L (ref 134–144)

## 2017-08-20 NOTE — Telephone Encounter (Signed)
Follow up   Patient calling to discuss procedure scheduled for 11/23. Patient states he did not get any paperwork when he came in for labs. Please call

## 2017-08-20 NOTE — Telephone Encounter (Signed)
Spoke with patient and gave him instructions regarding his TEE/Ablation procedure scheduled 11/23.  Patient verbalized understanding.

## 2017-08-28 ENCOUNTER — Encounter (HOSPITAL_COMMUNITY): Payer: Self-pay | Admitting: *Deleted

## 2017-08-28 ENCOUNTER — Ambulatory Visit (HOSPITAL_COMMUNITY): Payer: Medicare Other | Admitting: Anesthesiology

## 2017-08-28 ENCOUNTER — Ambulatory Visit (HOSPITAL_COMMUNITY)
Admission: RE | Admit: 2017-08-28 | Discharge: 2017-08-28 | Disposition: A | Discharge status: Home / Self Care | Payer: Medicare Other | Source: Ambulatory Visit | Attending: Cardiology | Admitting: Cardiology

## 2017-08-28 ENCOUNTER — Encounter (HOSPITAL_COMMUNITY)
Admission: RE | Disposition: A | Discharge status: Home / Self Care | Payer: Self-pay | Source: Ambulatory Visit | Attending: Cardiology

## 2017-08-28 ENCOUNTER — Encounter (HOSPITAL_COMMUNITY): Payer: Self-pay | Admitting: Anesthesiology

## 2017-08-28 ENCOUNTER — Ambulatory Visit (HOSPITAL_BASED_OUTPATIENT_CLINIC_OR_DEPARTMENT_OTHER)
Admission: RE | Admit: 2017-08-28 | Discharge: 2017-08-28 | Disposition: A | Discharge status: Home / Self Care | Payer: Medicare Other | Source: Ambulatory Visit | Attending: Nurse Practitioner | Admitting: Nurse Practitioner

## 2017-08-28 DIAGNOSIS — Z79899 Other long term (current) drug therapy: Secondary | ICD-10-CM | POA: Insufficient documentation

## 2017-08-28 DIAGNOSIS — Z7901 Long term (current) use of anticoagulants: Secondary | ICD-10-CM | POA: Diagnosis not present

## 2017-08-28 DIAGNOSIS — Z87891 Personal history of nicotine dependence: Secondary | ICD-10-CM | POA: Insufficient documentation

## 2017-08-28 DIAGNOSIS — G4733 Obstructive sleep apnea (adult) (pediatric): Secondary | ICD-10-CM | POA: Insufficient documentation

## 2017-08-28 DIAGNOSIS — I481 Persistent atrial fibrillation: Secondary | ICD-10-CM | POA: Insufficient documentation

## 2017-08-28 DIAGNOSIS — I5022 Chronic systolic (congestive) heart failure: Secondary | ICD-10-CM | POA: Insufficient documentation

## 2017-08-28 DIAGNOSIS — Z6832 Body mass index (BMI) 32.0-32.9, adult: Secondary | ICD-10-CM | POA: Diagnosis not present

## 2017-08-28 DIAGNOSIS — G4730 Sleep apnea, unspecified: Secondary | ICD-10-CM | POA: Insufficient documentation

## 2017-08-28 DIAGNOSIS — Z7983 Long term (current) use of bisphosphonates: Secondary | ICD-10-CM | POA: Insufficient documentation

## 2017-08-28 DIAGNOSIS — E669 Obesity, unspecified: Secondary | ICD-10-CM | POA: Diagnosis not present

## 2017-08-28 DIAGNOSIS — I4891 Unspecified atrial fibrillation: Secondary | ICD-10-CM

## 2017-08-28 DIAGNOSIS — K219 Gastro-esophageal reflux disease without esophagitis: Secondary | ICD-10-CM | POA: Diagnosis not present

## 2017-08-28 HISTORY — PX: TEE WITHOUT CARDIOVERSION: SHX5443

## 2017-08-28 SURGERY — ECHOCARDIOGRAM, TRANSESOPHAGEAL
Anesthesia: Monitor Anesthesia Care

## 2017-08-28 SURGERY — ATRIAL FIBRILLATION ABLATION
Anesthesia: Monitor Anesthesia Care

## 2017-08-28 MED ORDER — LIDOCAINE HCL (CARDIAC) 20 MG/ML IV SOLN
INTRAVENOUS | Status: DC | PRN
  Administered 2017-08-28: 60 mg via INTRATRACHEAL

## 2017-08-28 MED ORDER — PROPOFOL 10 MG/ML IV BOLUS
INTRAVENOUS | Status: DC | PRN
  Administered 2017-08-28 (×2): 20 mg via INTRAVENOUS

## 2017-08-28 MED ORDER — PERFLUTREN LIPID MICROSPHERE
INTRAVENOUS | Status: AC
  Filled 2017-08-28: qty 10

## 2017-08-28 MED ORDER — SODIUM CHLORIDE 0.9 % IV SOLN
INTRAVENOUS | Status: DC
  Administered 2017-08-28: 07:00:00 via INTRAVENOUS

## 2017-08-28 MED ORDER — BUTAMBEN-TETRACAINE-BENZOCAINE 2-2-14 % EX AERO
INHALATION_SPRAY | CUTANEOUS | Status: DC | PRN
  Administered 2017-08-28: 2 via TOPICAL

## 2017-08-28 MED ORDER — PERFLUTREN LIPID MICROSPHERE
INTRAVENOUS | Status: DC | PRN
  Administered 2017-08-28: 2 mL via INTRAVENOUS

## 2017-08-28 MED ORDER — PROPOFOL 500 MG/50ML IV EMUL
INTRAVENOUS | Status: DC | PRN
  Administered 2017-08-28: 100 ug/kg/min via INTRAVENOUS

## 2017-08-28 NOTE — Transfer of Care (Signed)
Immediate Anesthesia Transfer of Care Note  Patient: Charles Kent.  Procedure(s) Performed: TRANSESOPHAGEAL ECHOCARDIOGRAM (TEE) (N/A )  Patient Location: Endoscopy Unit  Anesthesia Type:MAC  Level of Consciousness: awake, alert  and oriented  Airway & Oxygen Therapy: Patient Spontanous Breathing and Patient connected to nasal cannula oxygen  Post-op Assessment: Report given to RN, Post -op Vital signs reviewed and stable and Patient moving all extremities X 4  Post vital signs: Reviewed and stable  Last Vitals:  Vitals:   08/28/17 0847 08/28/17 0849  BP: 124/90   Pulse: 63   Resp: 12   Temp: 36.5 C   SpO2: (!) 88% 91%    Last Pain:  Vitals:   08/28/17 0847  TempSrc: Oral         Complications: No apparent anesthesia complications

## 2017-08-28 NOTE — H&P (Signed)
CC: afib   History of Present Illness: Charles HearingHarvey R Plaisted Jr. is a 61 y.o. male who presents today for electrophysiology study and ablation for atrial fibrillation.  The patient was diagnosed with atrial fibrillation 2/18 after presenting to Dr Jeannetta NapElkins for routine exam and having afib diagnosed.  In retrospect, he thinks that he has been in afib for 2-3 years.  He reports symptoms of fatigue and decreased exercise tolerance.  He has been treated with amiodarone and has been cardioverted but did not maintain sinus rhythm.  He did feel "better" early after cardioversion.  He thinks he returned to afib after a month as his symptoms returned.  He has OSA but has not been able to use CPAP.  [facility] and is considering dental appliance.  He has an EF of 40% with moderate LVH and moderate atrial enlargement.  Today, he denies symptoms of palpitations, chest pain, shortness of breath, orthopnea, PND, lower extremity edema, claudication, dizziness, presyncope, syncope, bleeding, or neurologic sequela. The patient is tolerating medications without difficulties and is otherwise without complaint today.        Past Medical History:  Diagnosis Date  . Anticoagulant long-term use   . Cardiomyopathy (HCC)   . 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 Dyspnea   . GERD (gastroesophageal reflux disease)   . H/O hiatal hernia   . Obesity   . Persistent atrial fibrillation (HCC) 01/05/2017   CHA2DS2VASC score 2 Diagnosis February 2018  . Renal stones   . Sleep apnea         Past Surgical History:  Procedure Laterality Date  . APPENDECTOMY    . CARDIOVERSION N/A 01/08/2017   Procedure: CARDIOVERSION;  Surgeon: Othella BoyerWilliam S Tilley, MD;  [facility];  Service: Cardiovascular;  Laterality: N/A;  . EYE SURGERY     Cataract right with Lens  . HERNIA REPAIR     Right x 2 2004, 2005  .  NASAL SEPTOPLASTY W/ TURBINOPLASTY  12/24/2011   Procedure: NASAL SEPTOPLASTY WITH TURBINATE REDUCTION;  Surgeon: Keturah BarreJames J Crossley, MD;  [facility];  Service: ENT;  Laterality: Bilateral;  . UVULOPALATOPHARYNGOPLASTY  12/24/2011   Procedure: UVULOPALATOPHARYNGOPLASTY (UPPP);  Surgeon: Keturah BarreJames J Crossley, MD;  [facility];  Service: ENT;  Laterality: Bilateral;  SEPTAL RECONSTRUCTION ANB BILATERIAL TURBINATE REDUCTION           Current Outpatient Prescriptions  Medication Sig Dispense Refill  . amiodarone (PACERONE) 200 MG tablet Take by mouth.    Marland Kitchen. apixaban (ELIQUIS) 5 MG TABS tablet Take by mouth.    Marland Kitchen. lisinopril (PRINIVIL,ZESTRIL) 2.5 MG tablet Take by mouth.    . Magnesium 250 MG TABS Take 250 mg by mouth daily.    . meloxicam (MOBIC) 7.5 MG tablet Take by mouth.    . metoprolol succinate (TOPROL-XL) 50 MG 24 hr tablet Take 1 tablet (50 mg total) by mouth daily. Take with or immediately following a meal. 30 tablet 12  . Multiple Vitamin (MULTIVITAMIN WITH MINERALS) TABS tablet Take 1 tablet by mouth daily.    Marland Kitchen. omeprazole (PRILOSEC) 20 MG capsule Take 20 mg by mouth daily.    . pantoprazole (PROTONIX) 40 MG tablet TAKE 1 TABLET BY MOUTH DAILY 30-60 MIN BEFORE FIRST MEAL OF THE DAY 30 tablet 0  . sulfamethoxazole-trimethoprim (BACTRIM DS,SEPTRA DS) 800-160 MG tablet TAKE 1 TABLET BYMOUTH EVERY 12 HOURS FOR URINE  0  .  tamsulosin (FLOMAX) 0.4 MG CAPS capsule Take by mouth.     No current facility-administered medications for this visit.     Allergies:   Patient has no known allergies.   Social History:  The patient  reports that he quit smoking about 12 years ago. His smoking use included Cigarettes. He has a 32.00 pack-year smoking history. He has never used smokeless tobacco. He reports that he does not drink alcohol or use drugs.   Family History:  The patient's  family history includes Cancer - Other in his father; Heart disease in his mother.     ROS:  Please see the history of present illness.   All other systems are personally reviewed and negative.    PHYSICAL EXAM: Vitals:   08/28/17 0659  BP: (!) 137/100  Pulse: 64  Resp: 17  Temp: 98.1 F (36.7 C)  SpO2: 93%   GEN: overweight, in no acute distress  HEENT: normal  Neck: no JVD, carotid bruits, or masses Cardiac: iRRR; no murmurs, rubs, or gallops,no edema  Respiratory:  clear to auscultation bilaterally, normal work of breathing GI: soft, nontender, nondistended, + BS MS: no deformity or atrophy  Skin: warm and dry  Neuro:  Strength and sensation are intact Psych: euthymic mood, full affect    ASSESSMENT AND PLAN:  1.  Persistent atrial fibrillation The patient has symptomatic persistent atrial fibrillation.  He has failed medical therapy with amiodarone.  He reports compliance with eliquis without interruption.  Will proceed with TEE today prior to ablation to exclude LAA thrombus. Therapeutic strategies for afib including medicine and ablation were discussed in detail with the patient today. Risk, benefits, and alternatives to EP study and radiofrequency ablation for afib were also discussed in detail today. These risks include but are not limited to stroke, bleeding, vascular damage, tamponade, perforation, damage to the esophagus, lungs, and other structures, pulmonary vein stenosis, worsening renal function, and death. The patient understands these risk and wishes to proceed at this time.  2. OSA Importance of compliance with treatment discussed He will follow-up at [facility] for options as he cannot tolerate CPAP  3. Obesity Body mass index is 32.71 kg/m. Weight loss advised  4. Chronic systolic dysfunction Felt to likely be tachycardia mediated in etiology No changes at this time.  Hillis Range MD, [facility] 08/28/2017 7:36 AM

## 2017-08-28 NOTE — Interval H&P Note (Signed)
History and Physical Interval Note:  08/28/2017 8:14 AM  Charles Kent.  has presented today for surgery, with the diagnosis of AFIB   The various methods of treatment have been discussed with the patient and family. After consideration of risks, benefits and other options for treatment, the patient has consented to  Procedure(s): TRANSESOPHAGEAL ECHOCARDIOGRAM (TEE) (N/A) as a surgical intervention .  The patient's history has been reviewed, patient examined, no change in status, stable for surgery.  I have reviewed the patient's chart and labs.  Questions were answered to the patient's satisfaction.     Armanda Magic

## 2017-08-28 NOTE — Anesthesia Postprocedure Evaluation (Signed)
Anesthesia Post Note  Patient: Charles Kent.  Procedure(s) Performed: TRANSESOPHAGEAL ECHOCARDIOGRAM (TEE) (N/A )     Patient location during evaluation: PACU Anesthesia Type: MAC Level of consciousness: awake and alert Pain management: pain level controlled Vital Signs Assessment: post-procedure vital signs reviewed and stable Respiratory status: spontaneous breathing, nonlabored ventilation, respiratory function stable and patient connected to nasal cannula oxygen Cardiovascular status: stable and blood pressure returned to baseline Postop Assessment: no apparent nausea or vomiting Anesthetic complications: no    Last Vitals:  Vitals:   08/28/17 0849 08/28/17 0850  BP:  124/90  Pulse:  76  Resp:  11  Temp:    SpO2: 91% 93%    Last Pain:  Vitals:   08/28/17 0847  TempSrc: Oral                 Yanelli Zapanta S

## 2017-08-28 NOTE — Anesthesia Preprocedure Evaluation (Signed)
Anesthesia Evaluation  Patient identified by MRN, date of birth, ID band Patient awake    Reviewed: Allergy & Precautions, NPO status , Patient's Chart, lab work & pertinent test results  Airway Mallampati: II  TM Distance: >3 FB Neck ROM: Full    Dental no notable dental hx.    Pulmonary sleep apnea , former smoker,    Pulmonary exam normal breath sounds clear to auscultation       Cardiovascular negative cardio ROS  + dysrhythmias Atrial Fibrillation  Rhythm:Irregular Rate:Normal     Neuro/Psych negative neurological ROS  negative psych ROS   GI/Hepatic Neg liver ROS, GERD  ,  Endo/Other  negative endocrine ROS  Renal/GU negative Renal ROS  negative genitourinary   Musculoskeletal negative musculoskeletal ROS (+)   Abdominal   Peds negative pediatric ROS (+)  Hematology negative hematology ROS (+)   Anesthesia Other Findings   Reproductive/Obstetrics negative OB ROS                             Anesthesia Physical Anesthesia Plan  ASA: III  Anesthesia Plan: MAC   Post-op Pain Management:    Induction: Intravenous  PONV Risk Score and Plan: 0  Airway Management Planned: Simple Face Mask  Additional Equipment:   Intra-op Plan:   Post-operative Plan:   Informed Consent: I have reviewed the patients History and Physical, chart, labs and discussed the procedure including the risks, benefits and alternatives for the proposed anesthesia with the patient or authorized representative who has indicated his/her understanding and acceptance.   Dental advisory given  Plan Discussed with: CRNA and Surgeon  Anesthesia Plan Comments:         Anesthesia Quick Evaluation

## 2017-08-28 NOTE — Discharge Instructions (Signed)

## 2017-08-28 NOTE — Progress Notes (Signed)
Echocardiogram Echocardiogram Transesophageal with contrast has been performed.  Charles Kent 08/28/2017, 8:52 AM

## 2017-08-28 NOTE — CV Procedure (Signed)
    PROCEDURE NOTE:  Procedure:  Transesophageal echocardiogram Operator:  Armanda Magic, MD Indications:  Atrial Fibrillation Complications: None  During this procedure the patient is administered a total of Propofol 190 mg  to achieve and maintain moderate conscious sedation.  The patient's heart rate, blood pressure, and oxygen saturation are monitored continuously during the procedure by anesthesia.   Results: Normal LV size and mildly reduced function with EF 40-45% Normal RV size and function Normal RA Moderately dilated LA with significant spontaneous echo contrast in the body of the LA.  There is concern for an early forming thrombus in the LA appendage which is seen in 2 different view.  Definity contrast was used and cannot rule out thrombus.  Normal TV with mild TR Normal PV Normal MV with trivial MR Normal trileaflet AV with trivial AR Normal interatrial septum with no evidence of shunt by colorflow dopper  Normal thoracic and ascending aorta.  The patient tolerated the procedure well and was transferred back to their room in stable condition.  Signed: Armanda Magic, MD [facility]

## 2017-08-29 ENCOUNTER — Encounter (HOSPITAL_COMMUNITY): Payer: Self-pay | Admitting: Cardiology

## 2017-09-25 ENCOUNTER — Ambulatory Visit (HOSPITAL_COMMUNITY): Payer: Medicare Other | Admitting: Nurse Practitioner

## 2017-11-09 ENCOUNTER — Telehealth: Payer: Self-pay | Admitting: Internal Medicine

## 2017-11-09 NOTE — Telephone Encounter (Signed)
Returned call and spoke with both patient and wife.  He was off Eliquis for a procedure and resumed taking on 10/30/17.  They are calling in regards to rescheduling his ablation.  There was question as to a thrombus forming and his previous ablation was canceled.  They are going to come in to discuss procedure and moving forward on Monday 11/09/17 at 10:30

## 2017-11-09 NOTE — Telephone Encounter (Signed)
Patient wife [name] calling,   States that she would like to f/u on the update for scheduling ablation

## 2017-11-10 ENCOUNTER — Encounter: Payer: Self-pay | Admitting: Internal Medicine

## 2017-11-16 ENCOUNTER — Ambulatory Visit: Payer: Medicare Other | Admitting: Internal Medicine

## 2017-11-16 ENCOUNTER — Encounter: Payer: Self-pay | Admitting: Internal Medicine

## 2017-11-16 VITALS — BP 120/88 | HR 70 | Ht 72.0 in | Wt 241.0 lb

## 2017-11-16 DIAGNOSIS — I481 Persistent atrial fibrillation: Secondary | ICD-10-CM | POA: Diagnosis not present

## 2017-11-16 DIAGNOSIS — I519 Heart disease, unspecified: Secondary | ICD-10-CM | POA: Diagnosis not present

## 2017-11-16 DIAGNOSIS — I4811 Longstanding persistent atrial fibrillation: Secondary | ICD-10-CM

## 2017-11-16 DIAGNOSIS — G4733 Obstructive sleep apnea (adult) (pediatric): Secondary | ICD-10-CM | POA: Diagnosis not present

## 2017-11-16 DIAGNOSIS — I4819 Other persistent atrial fibrillation: Secondary | ICD-10-CM

## 2017-11-16 NOTE — Patient Instructions (Signed)
Medication Instructions:  Your physician has recommended you make the following change in your medication:   1. STOP: Amiodarone  2. STOP: Aspirin   Labwork: None ordered  Testing/Procedures: None ordered  Follow-Up: Your physician wants you to follow-up As Needed   Any Other Special Instructions Will Be Listed Below (If Applicable).     If you need a refill on your cardiac medications before your next appointment, please call your pharmacy.

## 2017-11-16 NOTE — Progress Notes (Signed)
PCP: Kaleen Mask, MD Primary Cardiologist: Dr Donnie Aho Primary EP: Dr Johney Frame  Charles Kent. is a 62 y.o. male who presents today for routine electrophysiology followup.  Since last being seen in our clinic, the patient reports doing reasonably well.  He feels that his energy is good and that he is able to do the things that he wishes to do.  Does not currently feel symptomatic/ limited by his afib.  He has been enrolled in the [place] trial (Upper Airway Stimulator for OSA) and follows with Dr Roby Lofts [facility].  Today, he denies symptoms of palpitations, chest pain, shortness of breath,  lower extremity edema, dizziness, presyncope, or syncope.  The patient is otherwise without complaint today.   Past Medical History:  Diagnosis Date  . Anticoagulant long-term use   . Cardiomyopathy (HCC)   . 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 Dyspnea   . GERD (gastroesophageal reflux disease)   . H/O hiatal hernia   . Obesity   . Persistent atrial fibrillation (HCC) 01/05/2017   CHA2DS2VASC score 2 Diagnosis February 2018  . Renal stones   . Sleep apnea    Past Surgical History:  Procedure Laterality Date  . APPENDECTOMY    . CARDIOVERSION N/A 01/08/2017   Procedure: CARDIOVERSION;  Surgeon: Othella Boyer, MD;  Location: [facility] ENDOSCOPY;  Service: Cardiovascular;  Laterality: N/A;  . EYE SURGERY     Cataract right with Lens  . HERNIA REPAIR     Right x 2 2004, 2005  . NASAL SEPTOPLASTY W/ TURBINOPLASTY  12/24/2011   Procedure: NASAL SEPTOPLASTY WITH TURBINATE REDUCTION;  Surgeon: Keturah Barre, MD;  Location: [facility] OR;  Service: ENT;  Laterality: Bilateral;  . TEE WITHOUT CARDIOVERSION N/A 08/28/2017   Procedure: TRANSESOPHAGEAL ECHOCARDIOGRAM (TEE);  Surgeon: Quintella Reichert, MD;  Location: [facility] ENDOSCOPY;  Service: Cardiovascular;  Laterality: N/A;  . UVULOPALATOPHARYNGOPLASTY  12/24/2011   Procedure: UVULOPALATOPHARYNGOPLASTY (UPPP);  Surgeon: Keturah Barre, MD;  Location: [facility] OR;  Service: ENT;  Laterality: Bilateral;  SEPTAL RECONSTRUCTION ANB BILATERIAL TURBINATE REDUCTION    ROS- all systems are reviewed and negatives except as per HPI above  Current Outpatient Medications  Medication Sig Dispense Refill  . amiodarone (PACERONE) 200 MG tablet Take 200 mg by mouth daily.     Marland Kitchen apixaban (ELIQUIS) 5 MG TABS tablet Take 5 mg by mouth 2 (two) times daily.     Marland Kitchen aspirin EC 81 MG tablet Take 81 mg 2 (two) times a week by mouth.    Marland Kitchen ibuprofen (ADVIL,MOTRIN) 200 MG tablet Take 400-800 mg by mouth 2 (two) times daily as needed for moderate pain.    Marland Kitchen lisinopril (PRINIVIL,ZESTRIL) 2.5 MG tablet Take 2.5 mg by mouth daily.     . magnesium oxide (MAG-OX) 400 MG tablet Take 400 mg by mouth daily.    . metoprolol succinate (TOPROL-XL) 50 MG 24 hr tablet Take 1 tablet (50 mg total) by mouth daily. Take with or immediately following a meal. (Patient taking differently: Take 50 mg by mouth 2 (two) times daily. Take with or immediately following a meal. ) 30 tablet 12  . Multiple Vitamin (MULTIVITAMIN WITH MINERALS) TABS tablet Take 1 tablet by mouth daily.     No current facility-administered medications for this visit.     Physical Exam: Vitals:   11/16/17 1104  BP: 120/88  Pulse: 70  Weight: 241 lb (109.3  kg)  Height: 6' (1.829 m)    GEN- The patient is overweight appearing, alert and oriented x 3 today.   Head- normocephalic, atraumatic Eyes-  Sclera clear, conjunctiva pink Ears- Kent intact Oropharynx- clear Lungs- Clear to ausculation bilaterally, normal work of breathing Heart- irregular rate and rhythm, no murmurs, rubs or gallops, PMI not laterally displaced GI- soft, NT, ND, + BS Extremities- no clubbing, cyanosis, or edema  EKG tracing ordered today is personally reviewed and shows afib, V rate 70 bpm, RBBB, LAHB  Assessment and Plan:  1. Longstanding  persistent atrial fibrillation He has been in afib for over a year.  He is also not compliant with OSA therapy previously.  He is optimistic that he will do well with the [facility] trial (see above). Failed medical therapy with amiodarone.  I will stop amiodarone today Therapeutic strategies for afib including rate and rhythm control were discussed in detail with the patient today.   I am not very enthusiastic that he will maintain sinus long term and am also not very convinced that his symptoms current warrant abalation. Recently scheduled for afib ablation however there was concern by Dr Mayford Knife for early forming LAA thrombus and the procedure was cancelled.  Compliance with anticoagulation has also been an issue previously.  At this time, I have advised rate control going forward.  I did offer second opinion with Dr Macon Large at [facility].  For now, he would prefer to continue rate control and will contact my office if he decides to consider [facility] EP referral.  2. OSA He has been enrolled in the [employer] trial (Upper Airway Stimulator for OSA) and follows with Dr Roby Lofts at [facility].  He has not previously tolerated CPAP  3. Obesity Body mass index is 32.69 kg/m. Lifestyle modification encouraged  4. Chronic systolic dysfunction Stable No change required today  Hillis Range MD, [facility] 11/16/2017 11:11 AM

## 2017-11-30 ENCOUNTER — Ambulatory Visit: Payer: Medicare Other | Admitting: Internal Medicine

## 2018-01-11 ENCOUNTER — Encounter: Payer: Self-pay | Admitting: Cardiology

## 2018-07-20 NOTE — Progress Notes (Signed)
[place] Constable    Date of visit:  01/11/2018 DOB:  Jan 09, 1956    Age:  61 yrs. Medical record number:  [id number]     Account number:  [id number] Primary Care Provider: Windle Guard ____________________________ CURRENT DIAGNOSES  1. Persistent atrial fibrillation  2. Chronic systolic heart failure  3. Cardiomyopathy  4. Long term (current) use of anticoagulants  5. Obesity  6. Sleep apnea ____________________________ ALLERGIES  NKDA ____________________________ MEDICATIONS  1. Eliquis 5 mg tablet, BID  2. lisinopril 2.5 mg tablet, 1 p.o. daily  3. magnesium 250 mg tablet, 1 p.o. daily PRN  4. metoprolol succinate ER 50 mg tablet,extended release 24 hr, BID  5. multivitamin tablet, 1 p.o. daily PRN ____________________________ HISTORY OF PRESENT ILLNESS Patient returns for cardiac followup. In February he saw the electrophysiologist who felt that it is not that symptomatic at the time and did not wish to pursue ablation with him. The patient was taken off of amiodarone and continues on anticoagulation. He denies angina and has no bleeding complications from anticoagulation. He had a implantable device to regulate sleep apnea put in at [facility] and is currently in a study for this. He reports that he feels better with better color and now is able to dream and have better quality sleep than he had before. ____________________________ PAST HISTORY  Past Medical Illnesses:  sleep apnea, hiatal hernia, obesity, denies hypertension or diabetes;  Cardiovascular Illnesses:  atrial fibrillation, cardiomyopathy(idiopathic);  Surgical Procedures:  appendectomy, inguinal hernia, uvulopalatopharyngoplasty Dr. Haroldine Laws 2013;  NYHA Classification:  II;  Canadian Angina Classification:  Class 0: Asymptomatic;  Cardiology Procedures-Invasive:  cardioversion April 2018;  Cardiology Procedures-Noninvasive:  echocardiogram February 2018, echocardiogram July 2018;  LVEF of 40% documented via echocardiogram on  04/20/2017,   CHA2DS2-VASC Score:  2 ____________________________ CARDIO-PULMONARY TEST DATES EKG Date:  10/09/2017;  Echocardiography Date: 04/20/2017;  Chest Xray Date: 01/05/2017;   ____________________________ SOCIAL HISTORY Alcohol Use:  no alcohol use;  Smoking:  used to smoke but quit 2007;  Diet:  vegetarian;  Lifestyle:  divorced and remarried;  Exercise:  some exercise;  Occupation:  retired, Probation officer;  Residence:  lives with wife;   ____________________________ REVIEW OF SYSTEMS General:  denies recent weight change, fatique or change in exercise tolerance. Eyes: cataract extraction, floaters Ears, Nose, Throat, Mouth:  pulsatile tinnitis Respiratory: mild dyspnea with exertion Cardiovascular:  please review HPI Abdominal: denies dyspepsia, GI bleeding, constipation, or diarrhea Genitourinary-Male: no dysuria, urgency, frequency, or nocturia  Musculoskeletal:  chronic back pain, arthritis of the hands, arthritis of the knees Neurological:  denies headaches, stroke, or TIA  ____________________________ PHYSICAL EXAMINATION VITAL SIGNS  Blood Pressure:  120/70 Sitting, Left arm, large cuff  , 110/70 Standing, Left arm and large cuff   Pulse:  68/min. Weight:  238.00 lbs. Height:  72.00"BMI: 32  Constitutional:  pleasant white male in no acute distress, moderately obese Skin:  tatoo on left arm Head:  normocephalic, normal hair pattern, no masses or tenderness Neck:  supple, without massess. No JVD, thyromegaly or carotid bruits. Carotid upstroke normal. Chest:  normal symmetry, clear to auscultation. Cardiac:  irregularly irregular rhythm, normal S1 and S2, no S3 or S4 Peripheral Pulses:  the femoral,dorsalis pedis, and posterior tibial pulses are full and equal bilaterally with no bruits auscultated. Extremities & Back:  no deformities, clubbing, cyanosis, erythema or edema observed. Normal muscle strength and tone. Neurological:  no gross motor or sensory deficits noted,  affect appropriate, oriented x3. ____________________________ IMPRESSIONS/PLAN  1. Chronic  atrial fibrillation currently rate controlled 2. Hypertensive heart disease 3. Chronic systolic heart failure better controlled 4. Obesity need to lose weight  Recommendations:  Note from electrophysiology read. Recommended followup in 6 months. EKG shows right bundle branch block left axis deviation atrial fibrillation with controlled response.  ____________________________ TODAYS ORDERS  1. 12 Lead EKG: 6 months  2. Return Visit: 6 months                       ____________________________ Cardiology Physician:  Darden Palmer MD [facility]

## 2018-08-04 DIAGNOSIS — I429 Cardiomyopathy, unspecified: Secondary | ICD-10-CM | POA: Insufficient documentation

## 2018-08-04 NOTE — Progress Notes (Deleted)
Cardiology Office Note:    Date:  08/04/2018   ID:  Charles Hearing., DOB [birth year], MRN [id number]  PCP:  Kaleen Mask, MD  Cardiologist:  Norman Herrlich, MD    Referring MD: Kaleen Mask, *    ASSESSMENT:    No diagnosis found. PLAN:    In order of problems listed above:  1. ***   Next appointment: ***   Medication Adjustments/Labs and Tests Ordered: Current medicines are reviewed at length with the patient today.  Concerns regarding medicines are outlined above.  No orders of the defined types were placed in this encounter.  No orders of the defined types were placed in this encounter.   No chief complaint on file.   History of Present Illness:    Charles Thrush. is a 62 y.o. male with a hx of chronic atrial fibrillation and heart failure ejection fraction 40% 04/20/2017.  He was last seen by Dr. Donnie Aho 01/11/2018.  His EKG showed right bundle branch block left axis deviation and rate controlled atrial fibrillation. Compliance with diet, lifestyle and medications: *** Past Medical History:  Diagnosis Date  . Anticoagulant long-term use   . Cardiomyopathy (HCC)   . 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 Dyspnea   . GERD (gastroesophageal reflux disease)   . H/O hiatal hernia   . Obesity   . Persistent atrial fibrillation 01/05/2017   CHA2DS2VASC score 2 Diagnosis February 2018  . Renal stones   . Sleep apnea     Past Surgical History:  Procedure Laterality Date  . APPENDECTOMY    . CARDIOVERSION N/A 01/08/2017   Procedure: CARDIOVERSION;  Surgeon: Othella Boyer, MD;  Location: [facility] ENDOSCOPY;  Service: Cardiovascular;  Laterality: N/A;  . EYE SURGERY     Cataract right with Lens  . HERNIA REPAIR     Right x 2 2004, 2005  . NASAL SEPTOPLASTY W/ TURBINOPLASTY  12/24/2011   Procedure: NASAL SEPTOPLASTY WITH TURBINATE REDUCTION;  Surgeon: Keturah Barre, MD;  Location: [facility] OR;  Service: ENT;  Laterality: Bilateral;  . TEE WITHOUT CARDIOVERSION N/A 08/28/2017   Procedure: TRANSESOPHAGEAL ECHOCARDIOGRAM (TEE);  Surgeon: Quintella Reichert, MD;  Location: [facility] ENDOSCOPY;  Service: Cardiovascular;  Laterality: N/A;  . UVULOPALATOPHARYNGOPLASTY  12/24/2011   Procedure: UVULOPALATOPHARYNGOPLASTY (UPPP);  Surgeon: Keturah Barre, MD;  Location: [facility] OR;  Service: ENT;  Laterality: Bilateral;  SEPTAL RECONSTRUCTION ANB BILATERIAL TURBINATE REDUCTION    Current Medications: No outpatient medications have been marked as taking for the 08/05/18 encounter (Appointment) with Charles Daub, MD.     Allergies:   Patient has no known allergies.   Social History   Socioeconomic History  . Marital status: Married    Spouse name: Not on file  . Number of children: Not on file  . Years of education: Not on file  . Highest education level: Not on file  Occupational History  . Not on file  Social Needs  . Financial resource strain: Not on file  . Food insecurity:    Worry: Not on file    Inability: Not on file  . Transportation needs:    Medical: Not on file    Non-medical: Not on file  Tobacco Use  . Smoking status: Former Smoker    Packs/day: 1.00    Years: 32.00    Pack years: 32.00    Types: Cigarettes  Last attempt to quit: 04/08/2005    Years since quitting: 13.3  . Smokeless tobacco: Never Used  Substance and Sexual Activity  . Alcohol use: No    Alcohol/week: 0.0 standard drinks  . Drug use: No  . Sexual activity: Not on file  Lifestyle  . Physical activity:    Days per week: Not on file    Minutes per session: Not on file  . Stress: Not on file  Relationships  . Social connections:    Talks on phone: Not on file    Gets together: Not on file    Attends religious service: Not on file    Active member of club or organization: Not on file    Attends meetings of clubs or organizations: Not on file    Relationship status:  Not on file  Other Topics Concern  . Not on file  Social History Narrative  . Not on file     Family History: The patient's ***family history includes Cancer - Other in his father; Heart disease in his mother. There is no history of Anesthesia problems. ROS:   Please see the history of present illness.    All other systems reviewed and are negative.  EKGs/Labs/Other Studies Reviewed:    The following studies were reviewed today:  EKG:  EKG ordered today.  The ekg ordered today demonstrates ***  Recent Labs: 08/19/2017: BUN 12; Creatinine, Ser 1.10; Hemoglobin 16.0; Platelets 182; Potassium 4.7; Sodium 142  Recent Lipid Panel No results found for: CHOL, TRIG, HDL, CHOLHDL, VLDL, LDLCALC, LDLDIRECT  Physical Exam:    VS:  There were no vitals taken for this visit.    Wt Readings from Last 3 Encounters:  11/16/17 241 lb (109.3 kg)  08/28/17 235 lb (106.6 kg)  07/28/17 235 lb (106.6 kg)     GEN: *** Well nourished, well developed in no acute distress HEENT: Normal NECK: No JVD; No carotid bruits LYMPHATICS: No lymphadenopathy CARDIAC: ***RRR, no murmurs, rubs, gallops RESPIRATORY:  Clear to auscultation without rales, wheezing or rhonchi  ABDOMEN: Soft, non-tender, non-distended MUSCULOSKELETAL:  No edema; No deformity  SKIN: Warm and dry NEUROLOGIC:  Alert and oriented x 3 PSYCHIATRIC:  Normal affect    Signed, Norman Herrlich, MD  08/04/2018 1:16 PM    [facility]

## 2018-08-05 ENCOUNTER — Ambulatory Visit: Payer: Medicare Other | Admitting: Cardiology

## 2018-08-05 DIAGNOSIS — I110 Hypertensive heart disease with heart failure: Secondary | ICD-10-CM | POA: Insufficient documentation

## 2018-09-24 ENCOUNTER — Other Ambulatory Visit: Payer: Self-pay | Admitting: Orthopedic Surgery

## 2018-09-24 DIAGNOSIS — M25512 Pain in left shoulder: Secondary | ICD-10-CM

## 2018-10-07 ENCOUNTER — Other Ambulatory Visit: Payer: Self-pay | Admitting: Orthopedic Surgery

## 2018-10-07 ENCOUNTER — Other Ambulatory Visit (HOSPITAL_COMMUNITY): Payer: Self-pay | Admitting: Orthopedic Surgery

## 2018-10-07 DIAGNOSIS — M25512 Pain in left shoulder: Secondary | ICD-10-CM

## 2018-10-12 ENCOUNTER — Ambulatory Visit (HOSPITAL_COMMUNITY)
Admission: RE | Admit: 2018-10-12 | Discharge: 2018-10-12 | Disposition: A | Discharge status: Home / Self Care | Payer: Medicare Other | Source: Ambulatory Visit | Attending: Orthopedic Surgery | Admitting: Orthopedic Surgery

## 2018-10-12 DIAGNOSIS — M25512 Pain in left shoulder: Secondary | ICD-10-CM | POA: Diagnosis not present

## 2018-11-25 ENCOUNTER — Telehealth: Payer: Self-pay | Admitting: Internal Medicine

## 2018-11-25 NOTE — Telephone Encounter (Signed)
Tried to call pt with both #'s on file and the message comes on and says "the person you are trying to reach has calling restrictions that prohibits my call from being completed.  Will route to requesting surgeon's office and have them have pt call and make an appt for surgical clearance.

## 2018-11-25 NOTE — Telephone Encounter (Signed)
New Message     [facility] Pre-operative Risk Assessment    Request for surgical clearance:  1. What type of surgery is being performed? Left Should ac reconstruction   2. When is this surgery scheduled? 12/14/18  3. What type of clearance is required (medical clearance vs. Pharmacy clearance to hold med vs. Both)? Both  4. Are there any medications that need to be held prior to surgery and how long? Any type of blood thinners  5. Practice name and name of physician performing surgery? [facility] Dr Tania Ade   6. What is your office phone number [phone]   7.   What is your office fax number [phone]  8.   Anesthesia type (None, local, MAC, general) ? Choice   Charles Kent 11/25/2018, 10:07 AM  _________________________________________________________________   (provider comments below)

## 2018-11-25 NOTE — Telephone Encounter (Signed)
Patient with diagnosis of afib on Eliquis for anticoagulation.    Procedure:  Left Shoulder reconstruction  Date of procedure: 12/14/18  CHADS2-VASc score of  2 (CHF, HTN, AGE, DM2, stroke/tia x 2, CAD, AGE, male)  Per office protocol, patient can hold Eliquis for 3 days prior to procedure.

## 2018-11-25 NOTE — Telephone Encounter (Signed)
   Primary Cardiologist:W Viann Fish, MD  Chart reviewed as part of pre-operative protocol coverage. Because of Charles STRYCKER Jr.'s past medical history and time since last visit, he/she will require a follow-up visit in order to better assess preoperative cardiovascular risk.  Pre-op covering staff: - Please schedule appointment and call patient to inform them. - Please contact requesting surgeon's office via preferred method (i.e, phone, fax) to inform them of need for appointment prior to surgery.  If applicable, this message will also be routed to pharmacy pool and/or primary cardiologist for input on holding anticoagulant/antiplatelet agent as requested below so that this information is available at time of patient's appointment. Pt is on Eliquis.   Robbie Lis, PA-C  11/25/2018, 10:28 AM

## 2018-11-26 NOTE — Telephone Encounter (Signed)
LFT DETAILED MESSAGE TO CB AND SCHEDULE AND APPT FOR CARDIAC CLEARANCE

## 2018-11-29 ENCOUNTER — Encounter: Payer: Self-pay | Admitting: Internal Medicine

## 2018-11-29 ENCOUNTER — Ambulatory Visit (INDEPENDENT_AMBULATORY_CARE_PROVIDER_SITE_OTHER): Payer: Medicare Other | Admitting: Internal Medicine

## 2018-11-29 VITALS — BP 128/86 | HR 94 | Ht 72.0 in | Wt 236.6 lb

## 2018-11-29 DIAGNOSIS — I519 Heart disease, unspecified: Secondary | ICD-10-CM

## 2018-11-29 DIAGNOSIS — G4733 Obstructive sleep apnea (adult) (pediatric): Secondary | ICD-10-CM

## 2018-11-29 DIAGNOSIS — I4819 Other persistent atrial fibrillation: Secondary | ICD-10-CM | POA: Diagnosis not present

## 2018-11-29 MED ORDER — METOPROLOL SUCCINATE ER 50 MG PO TB24: 50.0000 mg | ORAL_TABLET | Freq: Two times a day (BID) | ORAL | 3 refills | Status: DC

## 2018-11-29 NOTE — Patient Instructions (Addendum)
Medication Instructions:  Your physician recommends that you continue on your current medications as directed. Please refer to the Current Medication list given to you today.  -Toprol XL 50 mg one tablet by mouth twice a day  Labwork: None ordered.  Testing/Procedures: Your physician has requested that you have an echocardiogram. Echocardiography is a painless test that uses sound waves to create images of your heart. It provides your doctor with information about the size and shape of your heart and how well your heart's chambers and valves are working. This procedure takes approximately one hour. There are no restrictions for this procedure.  Please schedule for ECHO  Follow-Up: Your physician wants you to follow-up in: 3 months with general cardiology- first available  Your physician wants you to follow-up in: 6 months with AFIB clinic.   You will receive a reminder letter in the mail two months in advance. If you don't receive a letter, please call our office to schedule the follow-up appointment.  Any Other Special Instructions Will Be Listed Below (If Applicable).  If you need a refill on your cardiac medications before your next appointment, please call your pharmacy.

## 2018-11-29 NOTE — Telephone Encounter (Signed)
Pt is scheduled to see Dr. Johney Frame, today, 11/29/2018.

## 2018-11-29 NOTE — Telephone Encounter (Signed)
2nd attempt to reach pt re: surgical clearance and need of appt. Left another message for pt to call back.
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
## 2018-12-03 ENCOUNTER — Ambulatory Visit (HOSPITAL_COMMUNITY): Payer: Medicare Other | Attending: Internal Medicine

## 2018-12-03 DIAGNOSIS — I519 Heart disease, unspecified: Secondary | ICD-10-CM | POA: Diagnosis present

## 2018-12-03 DIAGNOSIS — I071 Rheumatic tricuspid insufficiency: Secondary | ICD-10-CM | POA: Insufficient documentation

## 2018-12-03 DIAGNOSIS — I371 Nonrheumatic pulmonary valve insufficiency: Secondary | ICD-10-CM | POA: Insufficient documentation

## 2018-12-08 ENCOUNTER — Other Ambulatory Visit: Payer: Self-pay | Admitting: Family Medicine

## 2018-12-08 ENCOUNTER — Ambulatory Visit
Admission: RE | Admit: 2018-12-08 | Discharge: 2018-12-08 | Disposition: A | Discharge status: Home / Self Care | Payer: Medicare Other | Source: Ambulatory Visit | Attending: Family Medicine | Admitting: Family Medicine

## 2018-12-08 ENCOUNTER — Other Ambulatory Visit: Payer: Self-pay | Admitting: Orthopedic Surgery

## 2018-12-08 DIAGNOSIS — Z01811 Encounter for preprocedural respiratory examination: Secondary | ICD-10-CM

## 2018-12-09 ENCOUNTER — Telehealth: Payer: Self-pay

## 2018-12-09 NOTE — Telephone Encounter (Signed)
Notified Pt ok to proceed with surgery per Dr. Johney Frame.  Pt would like ECHO sent to Dr. Jeannetta Nap along with Dr. Ave Filter.   Will forward test results and notify preop pool.

## 2018-12-09 NOTE — Telephone Encounter (Signed)
-----   Message from Hillis Range, MD sent at 12/07/2018  9:50 PM EST ----- Results reviewed.  Boneta Lucks, please inform pt of result. EF appears unchanged when compared to prior .  Continue current therapy.  Ok to proceed with surgery.

## 2018-12-10 NOTE — Progress Notes (Signed)
12/08/2018- noted in Epic-CXR  12/03/2018- noted in Epic- ECHO  11/29/2018- noted in Epic- Cardiac Clearance from Dr. Johney Frame and EKG

## 2018-12-10 NOTE — Patient Instructions (Addendum)
Charles Kent.  12/10/2018   Your procedure is scheduled on:   Thursday 12/16/2018  Report to [facility] Main  Entrance              Report to admitting at   100  PM    Call this number if you have problems the morning of surgery [phone]               Please Bring Remote for Generator for your sleep apnea!   Remember: Do not eat food  :After Midnight.   May have clear liquids from midnight up until 0900 am then nothing until after surgery!    CLEAR LIQUID DIET   Foods Allowed                                                                     Foods Excluded  Coffee and tea, regular and decaf                             liquids that you cannot  Plain Jell-O in any flavor                                             see through such as: Fruit ices (not with fruit pulp)                                     milk, soups, orange juice  Iced Popsicles                                    All solid food Carbonated beverages, regular and diet                                    Cranberry, grape and apple juices Sports drinks like Gatorade Lightly seasoned clear broth or consume(fat free) Sugar, honey syrup  Sample Menu Breakfast                                Lunch                                     Supper Cranberry juice                    Beef broth                            Chicken broth Jell-O  Grape juice                           Apple juice Coffee or tea                        Jell-O                                      Popsicle                                                Coffee or tea                        Coffee or tea  _____________________________________________________________________              BRUSH YOUR TEETH MORNING OF SURGERY AND RINSE YOUR MOUTH OUT, NO CHEWING GUM CANDY OR MINTS.     Take these medicines the morning of surgery with A SIP OF WATER: Metoprolol succinate (Toprol-XL)                    You may not have any metal on your body including hair pins and              piercings  Do not wear jewelry, make-up, lotions, powders or perfumes, deodorant                      Men may shave face and neck.   Do not bring valuables to the hospital. [facility] IS NOT             RESPONSIBLE   FOR VALUABLES.  Contacts, dentures or bridgework may not be worn into surgery.  Leave suitcase in the car. After surgery it may be brought to your room.     Patients discharged the day of surgery will not be allowed to drive home. IF YOU ARE HAVING SURGERY AND GOING HOME THE SAME DAY, YOU MUST HAVE AN ADULT TO DRIVE YOU HOME AND BE WITH YOU FOR 24 HOURS. YOU MAY GO HOME BY TAXI OR UBER OR ORTHERWISE, BUT AN ADULT MUST ACCOMPANY YOU HOME AND STAY WITH YOU FOR 24 HOURS.  Name and phone number of your driver:spouse- [name]                Please read over the following fact sheets you were given: _____________________________________________________________________             [facility] - Preparing for Surgery Before surgery, you can play an important role.  Because skin is not sterile, your skin needs to be as free of germs as possible.  You can reduce the number of germs on your skin by washing with CHG (chlorahexidine gluconate) soap before surgery.  CHG is an antiseptic cleaner which kills germs and bonds with the skin to continue killing germs even after washing. Please DO NOT use if you have an allergy to CHG or antibacterial soaps.  If your skin becomes reddened/irritated stop using the CHG and inform your nurse when you arrive at Short Stay. Do not shave (including legs and underarms) for at least 48 hours prior to the first CHG  shower.  You may shave your face/neck. Please follow these instructions carefully:  1.  Shower with CHG Soap the night before surgery and the  morning of Surgery.  2.  If you choose to wash your hair, wash your hair first as usual with your  normal   shampoo.  3.  After you shampoo, rinse your hair and body thoroughly to remove the  shampoo.                           4.  Use CHG as you would any other liquid soap.  You can apply chg directly  to the skin and wash                       Gently with a scrungie or clean washcloth.  5.  Apply the CHG Soap to your body ONLY FROM THE NECK DOWN.   Do not use on face/ open                           Wound or open sores. Avoid contact with eyes, ears mouth and genitals (private parts).                       Wash face,  Genitals (private parts) with your normal soap.             6.  Wash thoroughly, paying special attention to the area where your surgery  will be performed.  7.  Thoroughly rinse your body with warm water from the neck down.  8.  DO NOT shower/wash with your normal soap after using and rinsing off  the CHG Soap.                9.  Pat yourself dry with a clean towel.            10.  Wear clean pajamas.            11.  Place clean sheets on your bed the night of your first shower and do not  sleep with pets. Day of Surgery : Do not apply any lotions/deodorants the morning of surgery.  Please wear clean clothes to the hospital/surgery center.  FAILURE TO FOLLOW THESE INSTRUCTIONS MAY RESULT IN THE CANCELLATION OF YOUR SURGERY PATIENT SIGNATURE_________________________________  NURSE SIGNATURE__________________________________  ________________________________________________________________________   Rogelia MireIncentive Spirometer  An incentive spirometer is a tool that can help keep your lungs clear and active. This tool measures how well you are filling your lungs with each breath. Taking long deep breaths may help reverse or decrease the chance of developing breathing (pulmonary) problems (especially infection) following:  A long period of time when you are unable to move or be active. BEFORE THE PROCEDURE   If the spirometer includes an indicator to show your best effort, your nurse or  respiratory therapist will set it to a desired goal.  If possible, sit up straight or lean slightly forward. Try not to slouch.  Hold the incentive spirometer in an upright position. INSTRUCTIONS FOR USE  1. Sit on the edge of your bed if possible, or sit up as far as you can in bed or on a chair. 2. Hold the incentive spirometer in an upright position. 3. Breathe out normally. 4. Place the mouthpiece in your mouth and seal your lips tightly around it. 5. Breathe in slowly and as deeply  as possible, raising the piston or the ball toward the top of the column. 6. Hold your breath for 3-5 seconds or for as long as possible. Allow the piston or ball to fall to the bottom of the column. 7. Remove the mouthpiece from your mouth and breathe out normally. 8. Rest for a few seconds and repeat Steps 1 through 7 at least 10 times every 1-2 hours when you are awake. Take your time and take a few normal breaths between deep breaths. 9. The spirometer may include an indicator to show your best effort. Use the indicator as a goal to work toward during each repetition. 10. After each set of 10 deep breaths, practice coughing to be sure your lungs are clear. If you have an incision (the cut made at the time of surgery), support your incision when coughing by placing a pillow or rolled up towels firmly against it. Once you are able to get out of bed, walk around indoors and cough well. You may stop using the incentive spirometer when instructed by your caregiver.  RISKS AND COMPLICATIONS  Take your time so you do not get dizzy or light-headed.  If you are in pain, you may need to take or ask for pain medication before doing incentive spirometry. It is harder to take a deep breath if you are having pain. AFTER USE  Rest and breathe slowly and easily.  It can be helpful to keep track of a log of your progress. Your caregiver can provide you with a simple table to help with this. If you are using the  spirometer at home, follow these instructions: SEEK MEDICAL CARE IF:   You are having difficultly using the spirometer.  You have trouble using the spirometer as often as instructed.  Your pain medication is not giving enough relief while using the spirometer.  You develop fever of 100.5 F (38.1 C) or higher. SEEK IMMEDIATE MEDICAL CARE IF:   You cough up bloody sputum that had not been present before.  You develop fever of 102 F (38.9 C) or greater.  You develop worsening pain at or near the incision site. MAKE SURE YOU:   Understand these instructions.  Will watch your condition.  Will get help right away if you are not doing well or get worse. Document Released: 02/02/2007 Document Revised: 12/15/2011 Document Reviewed: 04/05/2007 [facility] Patient Information 2014 [place], [place].   ________________________________________________________________________

## 2018-12-10 NOTE — Telephone Encounter (Signed)
    Per Dr. Johney Frame, [facility] to proceed with surgery.   Per office protocol, pt can hold Eliquis for 3 days prior to procedure.   I will rax clearance to requesting provider and remove from preop pool.   Note    ----- Message from Hillis Range, MD sent at 12/07/2018  9:50 PM EST ----- Results reviewed.  Boneta Lucks, please inform pt of result. EF appears unchanged when compared to prior .  Continue current therapy.  Ok to proceed with surgery.

## 2018-12-14 ENCOUNTER — Encounter (HOSPITAL_COMMUNITY)
Admission: RE | Admit: 2018-12-14 | Discharge: 2018-12-14 | Disposition: A | Discharge status: Home / Self Care | Payer: Medicare Other | Source: Ambulatory Visit | Attending: Orthopedic Surgery | Admitting: Orthopedic Surgery

## 2018-12-14 ENCOUNTER — Other Ambulatory Visit: Payer: Self-pay

## 2018-12-14 ENCOUNTER — Encounter (HOSPITAL_COMMUNITY): Payer: Self-pay

## 2018-12-14 DIAGNOSIS — Z87891 Personal history of nicotine dependence: Secondary | ICD-10-CM | POA: Insufficient documentation

## 2018-12-14 DIAGNOSIS — S43122A Dislocation of left acromioclavicular joint, 100%-200% displacement, initial encounter: Secondary | ICD-10-CM | POA: Diagnosis present

## 2018-12-14 DIAGNOSIS — I351 Nonrheumatic aortic (valve) insufficiency: Secondary | ICD-10-CM | POA: Diagnosis not present

## 2018-12-14 DIAGNOSIS — I4819 Other persistent atrial fibrillation: Secondary | ICD-10-CM | POA: Insufficient documentation

## 2018-12-14 DIAGNOSIS — S43102A Unspecified dislocation of left acromioclavicular joint, initial encounter: Secondary | ICD-10-CM | POA: Insufficient documentation

## 2018-12-14 DIAGNOSIS — G4730 Sleep apnea, unspecified: Secondary | ICD-10-CM

## 2018-12-14 DIAGNOSIS — E669 Obesity, unspecified: Secondary | ICD-10-CM | POA: Diagnosis not present

## 2018-12-14 DIAGNOSIS — M1990 Unspecified osteoarthritis, unspecified site: Secondary | ICD-10-CM | POA: Diagnosis not present

## 2018-12-14 DIAGNOSIS — Z809 Family history of malignant neoplasm, unspecified: Secondary | ICD-10-CM | POA: Diagnosis not present

## 2018-12-14 DIAGNOSIS — Z6831 Body mass index (BMI) 31.0-31.9, adult: Secondary | ICD-10-CM | POA: Diagnosis not present

## 2018-12-14 DIAGNOSIS — I5022 Chronic systolic (congestive) heart failure: Secondary | ICD-10-CM

## 2018-12-14 DIAGNOSIS — Z79899 Other long term (current) drug therapy: Secondary | ICD-10-CM | POA: Insufficient documentation

## 2018-12-14 DIAGNOSIS — Z791 Long term (current) use of non-steroidal anti-inflammatories (NSAID): Secondary | ICD-10-CM | POA: Diagnosis not present

## 2018-12-14 DIAGNOSIS — Z7901 Long term (current) use of anticoagulants: Secondary | ICD-10-CM

## 2018-12-14 DIAGNOSIS — Z01812 Encounter for preprocedural laboratory examination: Secondary | ICD-10-CM

## 2018-12-14 DIAGNOSIS — K219 Gastro-esophageal reflux disease without esophagitis: Secondary | ICD-10-CM | POA: Diagnosis not present

## 2018-12-14 DIAGNOSIS — M25312 Other instability, left shoulder: Secondary | ICD-10-CM | POA: Diagnosis not present

## 2018-12-14 DIAGNOSIS — I110 Hypertensive heart disease with heart failure: Secondary | ICD-10-CM | POA: Diagnosis not present

## 2018-12-14 DIAGNOSIS — I429 Cardiomyopathy, unspecified: Secondary | ICD-10-CM

## 2018-12-14 DIAGNOSIS — K449 Diaphragmatic hernia without obstruction or gangrene: Secondary | ICD-10-CM | POA: Diagnosis not present

## 2018-12-14 DIAGNOSIS — I371 Nonrheumatic pulmonary valve insufficiency: Secondary | ICD-10-CM | POA: Diagnosis not present

## 2018-12-14 DIAGNOSIS — M24812 Other specific joint derangements of left shoulder, not elsewhere classified: Secondary | ICD-10-CM | POA: Diagnosis not present

## 2018-12-14 DIAGNOSIS — Z87442 Personal history of urinary calculi: Secondary | ICD-10-CM | POA: Diagnosis not present

## 2018-12-14 DIAGNOSIS — Z8249 Family history of ischemic heart disease and other diseases of the circulatory system: Secondary | ICD-10-CM | POA: Diagnosis not present

## 2018-12-14 HISTORY — DX: Cardiac arrhythmia, unspecified: I49.9

## 2018-12-14 LAB — CBC
HCT: 50.5 % (ref 39.0–52.0)
Hemoglobin: 16.5 g/dL (ref 13.0–17.0)
MCH: 32.5 pg (ref 26.0–34.0)
MCHC: 32.7 g/dL (ref 30.0–36.0)
MCV: 99.6 fL (ref 80.0–100.0)
Platelets: 157 10*3/uL (ref 150–400)
RBC: 5.07 MIL/uL (ref 4.22–5.81)
RDW: 13.4 % (ref 11.5–15.5)
WBC: 4.8 10*3/uL (ref 4.0–10.5)
nRBC: 0 % (ref 0.0–0.2)

## 2018-12-14 LAB — SURGICAL PCR SCREEN
MRSA, PCR: NEGATIVE
Staphylococcus aureus: NEGATIVE

## 2018-12-15 NOTE — Anesthesia Preprocedure Evaluation (Addendum)
Anesthesia Evaluation  Patient identified by MRN, date of birth, ID band Patient awake    Reviewed: Allergy & Precautions, NPO status , Patient's Chart, lab work & pertinent test results  History of Anesthesia Complications Negative for: history of anesthetic complications  Airway Mallampati: IV  TM Distance: >3 FB Neck ROM: Full  Mouth opening: Limited Mouth Opening  Dental  (+) Dental Advisory Given, Missing   Pulmonary shortness of breath, sleep apnea , former smoker,    breath sounds clear to auscultation       Cardiovascular hypertension, Pt. on medications and Pt. on home beta blockers +CHF  + dysrhythmias Atrial Fibrillation  Rhythm:Irregular     Neuro/Psych negative neurological ROS  negative psych ROS   GI/Hepatic Neg liver ROS, hiatal hernia, GERD  Medicated and Controlled,  Endo/Other  negative endocrine ROS  Renal/GU Renal disease     Musculoskeletal  (+) Arthritis ,   Abdominal   Peds  Hematology negative hematology ROS (+)   Anesthesia Other Findings  1. The left ventricle has moderately reduced systolic function, with an ejection fraction of 35-40%. The cavity size was normal. There is mildly increased left ventricular wall thickness. Left ventricular diastolic Doppler parameters are indeterminate  Left ventrical global hypokinesis without regional wall motion abnormalities.  2. The right ventricle has normal systolic function. The cavity was normal. There is no increase in right ventricular wall thickness.  3. Left atrial size was moderately dilated.  4. Trivial pericardial effusion is present.  5. The mitral valve is normal in structure. Mild thickening of the mitral valve leaflet.  6. The tricuspid valve is normal in structure.  7. The aortic valve is tricuspid Mild thickening of the aortic valve Mild calcification of the aortic valve. Aortic valve regurgitation is trivial by color flow Doppler.  8. The pulmonic valve was grossly normal. Pulmonic valve regurgitation is mild by color flow Doppler.  9. There is mild dilatation of the aortic root measuring 39 mm.   Reproductive/Obstetrics                           Anesthesia Physical Anesthesia Plan  ASA: III  Anesthesia Plan: General and Regional   Post-op Pain Management:  Regional for Post-op pain   Induction: Intravenous  PONV Risk Score and Plan: 2 and Ondansetron and Dexamethasone  Airway Management Planned: Oral ETT  Additional Equipment: None  Intra-op Plan:   Post-operative Plan: Extubation in OR  Informed Consent: I have reviewed the patients History and Physical, chart, labs and discussed the procedure including the risks, benefits and alternatives for the proposed anesthesia with the patient or authorized representative who has indicated his/her understanding and acceptance.     Dental advisory given  Plan Discussed with: CRNA and Surgeon  Anesthesia Plan Comments: (See PAT note 12/14/18, Jodell Cipro, PA-C)       Anesthesia Quick Evaluation

## 2018-12-15 NOTE — Progress Notes (Signed)
Called and spoke to patient about time change for surgery. Instructed patient to arrive at Admitting at [facility] at 0830 and have nothing to eat or drink after midnight. Take medications with a sip of water only. Patient verbalized understanding.

## 2018-12-15 NOTE — Progress Notes (Addendum)
Anesthesia Chart Review   Case:  [id number] Date/Time:  12/16/18 1015   Procedures:      ACROMIO-CLAVICULAR JOINT REPAIR (Left )     SHOULDER ARTHROSCOPY WITH DISTAL CLAVICLE RESECTION (Left )   Anesthesia type:  Choice   Pre-op diagnosis:  LEFT SHOULDER ACROMIOCLAVICULAR SEPERATION   Location:  [facility] ROOM 07 / [facility]   Surgeon:  Jones Broom, MD      DISCUSSION: 63 yo former smoker (32 pack years, quit 04/08/05) with h/o GERD, CHF, A-fib (Eliquis), OSA (s/p hypoglossal nerve stimulator implantation 10/27/17) left shoulder acromioclavicular separation scheduled for above procedure 12/16/18 with Dr. Jones Broom.   Pt last seen by Cardiologist, Dr. Hillis Range, 11/29/18.  Per his note, "Ok to proceed with surgery if echo is low risk. EF appears unchanged when compared to prior .  Continue current therapy.  Ok to proceed with surgery." Pt advised to hold Eliquis 3 days prior to surgery.    Pt with hypoglossal nerve stimulator implantation 10/28/27.  Spoke with [employer] rep.  Pt is to bring remote with him, recommends patient can resume use after recovery.  Surgical guidance document faxed to Dr. Ave Filter.   Pt can proceed with planned procedure barring acute status change.   VS: BP (!) 145/84   Pulse 78   Temp 36.6 C (Oral)   Resp 18   Ht 6' (1.829 m)   Wt 106.1 kg   SpO2 93%   BMI 31.74 kg/m   PROVIDERS: Kaleen Mask, MD is PCP   Hillis Range, MD is Cardiologist  LABS: Labs reviewed: Acceptable for surgery. (all labs ordered are listed, but only abnormal results are displayed)  Labs Reviewed  SURGICAL PCR SCREEN  CBC     IMAGES: Chest Xray 12/08/18 FINDINGS: Battery pack projects over the right chest wall with lead coursing into the neck, presumed vagal stimulator. An additional lead projects inferiorly terminating over the diaphragm/right upper quadrant. Cardiomegaly is unchanged from prior exams. Unchanged mediastinal contours with aortic tortuosity. No  pulmonary edema, focal airspace disease, pleural effusion, or pneumothorax. No acute osseous abnormalities. Remote left rib fractures.  IMPRESSION: Chronic cardiomegaly without acute abnormality.  EKG: 11/29/18 Rate 100 bpm Atrial fibrillation  Left axis deviation  Nonspecific intraventricular block  Abnormal ECG   CV: Echo 12/03/18 IMPRESSIONS  1. The left ventricle has moderately reduced systolic function, with an ejection fraction of 35-40%. The cavity size was normal. There is mildly increased left ventricular wall thickness. Left ventricular diastolic Doppler parameters are indeterminate  Left ventrical global hypokinesis without regional wall motion abnormalities.  2. The right ventricle has normal systolic function. The cavity was normal. There is no increase in right ventricular wall thickness.  3. Left atrial size was moderately dilated.  4. Trivial pericardial effusion is present.  5. The mitral valve is normal in structure. Mild thickening of the mitral valve leaflet.  6. The tricuspid valve is normal in structure.  7. The aortic valve is tricuspid Mild thickening of the aortic valve Mild calcification of the aortic valve. Aortic valve regurgitation is trivial by color flow Doppler.  8. The pulmonic valve was grossly normal. Pulmonic valve regurgitation is mild by color flow Doppler.  9. There is mild dilatation of the aortic root measuring 39 mm. Past Medical History:  Diagnosis Date  . Anticoagulant long-term use   . Cardiomyopathy (HCC)   . 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  Dyspnea   . Dysrhythmia    Atrial Fibrillation  . GERD (gastroesophageal reflux disease)   . H/O hiatal hernia   . Obesity   . Persistent atrial fibrillation 01/05/2017   CHA2DS2VASC score 2 Diagnosis February 2018  . Renal stones   . Sleep apnea    no CPAP, has nerve generator for sleep apnea     Past Surgical History:  Procedure Laterality Date  . APPENDECTOMY    . CARDIOVERSION N/A 01/08/2017   Procedure: CARDIOVERSION;  Surgeon: Othella Boyer, MD;  Location: [facility] ENDOSCOPY;  Service: Cardiovascular;  Laterality: N/A;  . EYE SURGERY     Cataract right with Lens  . HERNIA REPAIR     Right x 2 2004, 2005  . NASAL SEPTOPLASTY W/ TURBINOPLASTY  12/24/2011   Procedure: NASAL SEPTOPLASTY WITH TURBINATE REDUCTION;  Surgeon: Keturah Barre, MD;  Location: [facility] OR;  Service: ENT;  Laterality: Bilateral;  . TEE WITHOUT CARDIOVERSION N/A 08/28/2017   Procedure: TRANSESOPHAGEAL ECHOCARDIOGRAM (TEE);  Surgeon: Quintella Reichert, MD;  Location: [facility] ENDOSCOPY;  Service: Cardiovascular;  Laterality: N/A;  . UVULOPALATOPHARYNGOPLASTY  12/24/2011   Procedure: UVULOPALATOPHARYNGOPLASTY (UPPP);  Surgeon: Keturah Barre, MD;  Location: [facility] OR;  Service: ENT;  Laterality: Bilateral;  SEPTAL RECONSTRUCTION ANB BILATERIAL TURBINATE REDUCTION    MEDICATIONS: . apixaban (ELIQUIS) 5 MG TABS tablet  . ibuprofen (ADVIL,MOTRIN) 200 MG tablet  . lisinopril (PRINIVIL,ZESTRIL) 2.5 MG tablet  . metoprolol succinate (TOPROL XL) 50 MG 24 hr tablet  . OVER THE COUNTER MEDICATION   No current facility-administered medications for this encounter.     Janey Genta [facility] Pre-Surgical Testing [phone] 12/15/18 12:02 PM

## 2018-12-16 ENCOUNTER — Ambulatory Visit (HOSPITAL_COMMUNITY): Payer: Medicare Other | Admitting: Certified Registered"

## 2018-12-16 ENCOUNTER — Ambulatory Visit (HOSPITAL_COMMUNITY): Payer: Medicare Other | Admitting: Physician Assistant

## 2018-12-16 ENCOUNTER — Encounter (HOSPITAL_COMMUNITY): Payer: Self-pay | Admitting: *Deleted

## 2018-12-16 ENCOUNTER — Ambulatory Visit (HOSPITAL_COMMUNITY)
Admission: RE | Admit: 2018-12-16 | Discharge: 2018-12-16 | Disposition: A | Discharge status: Home / Self Care | Payer: Medicare Other | Attending: Orthopedic Surgery | Admitting: Orthopedic Surgery

## 2018-12-16 ENCOUNTER — Telehealth (HOSPITAL_COMMUNITY): Payer: Self-pay | Admitting: *Deleted

## 2018-12-16 ENCOUNTER — Encounter (HOSPITAL_COMMUNITY)
Admission: RE | Disposition: A | Discharge status: Home / Self Care | Payer: Self-pay | Source: Home / Self Care | Attending: Orthopedic Surgery

## 2018-12-16 ENCOUNTER — Ambulatory Visit (HOSPITAL_COMMUNITY): Payer: Medicare Other

## 2018-12-16 DIAGNOSIS — Z791 Long term (current) use of non-steroidal anti-inflammatories (NSAID): Secondary | ICD-10-CM | POA: Insufficient documentation

## 2018-12-16 DIAGNOSIS — G4730 Sleep apnea, unspecified: Secondary | ICD-10-CM | POA: Insufficient documentation

## 2018-12-16 DIAGNOSIS — M24812 Other specific joint derangements of left shoulder, not elsewhere classified: Secondary | ICD-10-CM | POA: Insufficient documentation

## 2018-12-16 DIAGNOSIS — Z87891 Personal history of nicotine dependence: Secondary | ICD-10-CM | POA: Insufficient documentation

## 2018-12-16 DIAGNOSIS — I351 Nonrheumatic aortic (valve) insufficiency: Secondary | ICD-10-CM | POA: Insufficient documentation

## 2018-12-16 DIAGNOSIS — I5022 Chronic systolic (congestive) heart failure: Secondary | ICD-10-CM | POA: Diagnosis not present

## 2018-12-16 DIAGNOSIS — M1990 Unspecified osteoarthritis, unspecified site: Secondary | ICD-10-CM | POA: Insufficient documentation

## 2018-12-16 DIAGNOSIS — Z87442 Personal history of urinary calculi: Secondary | ICD-10-CM | POA: Insufficient documentation

## 2018-12-16 DIAGNOSIS — E669 Obesity, unspecified: Secondary | ICD-10-CM | POA: Insufficient documentation

## 2018-12-16 DIAGNOSIS — I110 Hypertensive heart disease with heart failure: Secondary | ICD-10-CM | POA: Insufficient documentation

## 2018-12-16 DIAGNOSIS — I429 Cardiomyopathy, unspecified: Secondary | ICD-10-CM | POA: Insufficient documentation

## 2018-12-16 DIAGNOSIS — M25312 Other instability, left shoulder: Secondary | ICD-10-CM | POA: Insufficient documentation

## 2018-12-16 DIAGNOSIS — Z6831 Body mass index (BMI) 31.0-31.9, adult: Secondary | ICD-10-CM | POA: Insufficient documentation

## 2018-12-16 DIAGNOSIS — Z7901 Long term (current) use of anticoagulants: Secondary | ICD-10-CM | POA: Insufficient documentation

## 2018-12-16 DIAGNOSIS — Z79899 Other long term (current) drug therapy: Secondary | ICD-10-CM | POA: Insufficient documentation

## 2018-12-16 DIAGNOSIS — I4819 Other persistent atrial fibrillation: Secondary | ICD-10-CM | POA: Insufficient documentation

## 2018-12-16 DIAGNOSIS — Z419 Encounter for procedure for purposes other than remedying health state, unspecified: Secondary | ICD-10-CM

## 2018-12-16 DIAGNOSIS — I371 Nonrheumatic pulmonary valve insufficiency: Secondary | ICD-10-CM | POA: Insufficient documentation

## 2018-12-16 DIAGNOSIS — K449 Diaphragmatic hernia without obstruction or gangrene: Secondary | ICD-10-CM | POA: Insufficient documentation

## 2018-12-16 DIAGNOSIS — Z809 Family history of malignant neoplasm, unspecified: Secondary | ICD-10-CM | POA: Insufficient documentation

## 2018-12-16 DIAGNOSIS — K219 Gastro-esophageal reflux disease without esophagitis: Secondary | ICD-10-CM | POA: Insufficient documentation

## 2018-12-16 DIAGNOSIS — Z8249 Family history of ischemic heart disease and other diseases of the circulatory system: Secondary | ICD-10-CM | POA: Insufficient documentation

## 2018-12-16 HISTORY — PX: SHOULDER ARTHROSCOPY WITH DISTAL CLAVICLE RESECTION: SHX5675

## 2018-12-16 HISTORY — PX: ACROMIO-CLAVICULAR JOINT REPAIR: SHX5183

## 2018-12-16 LAB — BASIC METABOLIC PANEL
Anion gap: 8 (ref 5–15)
BUN: 22 mg/dL (ref 8–23)
CO2: 27 mmol/L (ref 22–32)
Calcium: 9 mg/dL (ref 8.9–10.3)
Chloride: 107 mmol/L (ref 98–111)
Creatinine, Ser: 0.92 mg/dL (ref 0.61–1.24)
GFR calc Af Amer: >60 mL/min (ref >60)
GFR calc non Af Amer: >60 mL/min (ref >60)
Glucose, Bld: 108 mg/dL — ABNORMAL HIGH (ref 70–99)
Potassium: 4 mmol/L (ref 3.5–5.1)
Sodium: 142 mmol/L (ref 135–145)

## 2018-12-16 SURGERY — REPAIR, ACROMIOCLAVICULAR JOINT
Anesthesia: Regional | Site: Shoulder | Laterality: Left

## 2018-12-16 MED ORDER — PROPOFOL 10 MG/ML IV BOLUS
INTRAVENOUS | Status: DC | PRN
  Administered 2018-12-16: 150 mg via INTRAVENOUS

## 2018-12-16 MED ORDER — SUGAMMADEX SODIUM 200 MG/2ML IV SOLN
INTRAVENOUS | Status: DC | PRN
  Administered 2018-12-16: 200 mg via INTRAVENOUS

## 2018-12-16 MED ORDER — BUPIVACAINE HCL (PF) 0.5 % IJ SOLN
INTRAMUSCULAR | Status: DC | PRN
  Administered 2018-12-16: 15 mL via PERINEURAL

## 2018-12-16 MED ORDER — FENTANYL CITRATE (PF) 100 MCG/2ML IJ SOLN: 25.0000 ug | INTRAMUSCULAR | Status: DC | PRN

## 2018-12-16 MED ORDER — SUGAMMADEX SODIUM 200 MG/2ML IV SOLN
INTRAVENOUS | Status: AC
  Filled 2018-12-16: qty 2

## 2018-12-16 MED ORDER — MIDAZOLAM HCL 2 MG/2ML IJ SOLN
INTRAMUSCULAR | Status: AC
  Filled 2018-12-16: qty 2

## 2018-12-16 MED ORDER — LIDOCAINE 2% (20 MG/ML) 5 ML SYRINGE
INTRAMUSCULAR | Status: AC
  Filled 2018-12-16: qty 5

## 2018-12-16 MED ORDER — 0.9 % SODIUM CHLORIDE (POUR BTL) OPTIME
TOPICAL | Status: DC | PRN
  Administered 2018-12-16: 1000 mL

## 2018-12-16 MED ORDER — ACETAMINOPHEN 10 MG/ML IV SOLN
1000.0000 mg | Freq: Once | INTRAVENOUS | Status: DC | PRN
  Administered 2018-12-16: 1000 mg via INTRAVENOUS

## 2018-12-16 MED ORDER — SUCCINYLCHOLINE CHLORIDE 200 MG/10ML IV SOSY
PREFILLED_SYRINGE | INTRAVENOUS | Status: DC | PRN
  Administered 2018-12-16: 100 mg via INTRAVENOUS

## 2018-12-16 MED ORDER — ALBUMIN HUMAN 5 % IV SOLN
INTRAVENOUS | Status: DC | PRN
  Administered 2018-12-16 (×2): via INTRAVENOUS

## 2018-12-16 MED ORDER — LACTATED RINGERS IV SOLN
INTRAVENOUS | Status: DC
  Administered 2018-12-16: 11:00:00 via INTRAVENOUS

## 2018-12-16 MED ORDER — ONDANSETRON HCL 4 MG/2ML IJ SOLN
INTRAMUSCULAR | Status: AC
  Filled 2018-12-16: qty 2

## 2018-12-16 MED ORDER — DEXAMETHASONE SODIUM PHOSPHATE 10 MG/ML IJ SOLN
INTRAMUSCULAR | Status: DC | PRN
  Administered 2018-12-16: 4 mg via INTRAVENOUS

## 2018-12-16 MED ORDER — DEXAMETHASONE SODIUM PHOSPHATE 10 MG/ML IJ SOLN
INTRAMUSCULAR | Status: AC
  Filled 2018-12-16: qty 1

## 2018-12-16 MED ORDER — CHLORHEXIDINE GLUCONATE 4 % EX LIQD
60.0000 mL | Freq: Once | CUTANEOUS | Status: AC
  Administered 2018-12-16: 4 via TOPICAL

## 2018-12-16 MED ORDER — PHENYLEPHRINE 40 MCG/ML (10ML) SYRINGE FOR IV PUSH (FOR BLOOD PRESSURE SUPPORT)
PREFILLED_SYRINGE | INTRAVENOUS | Status: AC
  Filled 2018-12-16: qty 20

## 2018-12-16 MED ORDER — PROPOFOL 10 MG/ML IV BOLUS
INTRAVENOUS | Status: AC
  Filled 2018-12-16: qty 20

## 2018-12-16 MED ORDER — CEFAZOLIN SODIUM-DEXTROSE 2-4 GM/100ML-% IV SOLN
2.0000 g | INTRAVENOUS | Status: AC
  Administered 2018-12-16: 2 g via INTRAVENOUS
  Filled 2018-12-16: qty 100

## 2018-12-16 MED ORDER — OXYCODONE-ACETAMINOPHEN 5-325 MG PO TABS: 1.0000 | ORAL_TABLET | ORAL | 0 refills | Status: DC | PRN

## 2018-12-16 MED ORDER — ACETAMINOPHEN 160 MG/5ML PO SOLN: 1000.0000 mg | Freq: Once | ORAL | Status: DC | PRN

## 2018-12-16 MED ORDER — BUPIVACAINE LIPOSOME 1.3 % IJ SUSP
INTRAMUSCULAR | Status: DC | PRN
  Administered 2018-12-16: 133 mg via PERINEURAL

## 2018-12-16 MED ORDER — ONDANSETRON HCL 4 MG/2ML IJ SOLN
INTRAMUSCULAR | Status: DC | PRN
  Administered 2018-12-16: 4 mg via INTRAVENOUS

## 2018-12-16 MED ORDER — FENTANYL CITRATE (PF) 100 MCG/2ML IJ SOLN
INTRAMUSCULAR | Status: AC
  Filled 2018-12-16: qty 2

## 2018-12-16 MED ORDER — SODIUM CHLORIDE 0.9 % IV SOLN
INTRAVENOUS | Status: DC | PRN
  Administered 2018-12-16: 40 ug/min via INTRAVENOUS

## 2018-12-16 MED ORDER — PHENYLEPHRINE 40 MCG/ML (10ML) SYRINGE FOR IV PUSH (FOR BLOOD PRESSURE SUPPORT)
PREFILLED_SYRINGE | INTRAVENOUS | Status: DC | PRN
  Administered 2018-12-16 (×4): 80 ug via INTRAVENOUS
  Administered 2018-12-16: 40 ug via INTRAVENOUS

## 2018-12-16 MED ORDER — OXYCODONE HCL 5 MG PO TABS: 5.0000 mg | ORAL_TABLET | Freq: Once | ORAL | Status: DC | PRN

## 2018-12-16 MED ORDER — ROCURONIUM BROMIDE 10 MG/ML (PF) SYRINGE
PREFILLED_SYRINGE | INTRAVENOUS | Status: DC | PRN
  Administered 2018-12-16 (×2): 10 mg via INTRAVENOUS
  Administered 2018-12-16: 50 mg via INTRAVENOUS

## 2018-12-16 MED ORDER — SUCCINYLCHOLINE CHLORIDE 200 MG/10ML IV SOSY
PREFILLED_SYRINGE | INTRAVENOUS | Status: AC
  Filled 2018-12-16: qty 10

## 2018-12-16 MED ORDER — MIDAZOLAM HCL 2 MG/2ML IJ SOLN
1.0000 mg | INTRAMUSCULAR | Status: DC
  Administered 2018-12-16 (×2): 1 mg via INTRAVENOUS
  Filled 2018-12-16: qty 2

## 2018-12-16 MED ORDER — OXYCODONE HCL 5 MG/5ML PO SOLN: 5.0000 mg | Freq: Once | ORAL | Status: DC | PRN

## 2018-12-16 MED ORDER — VASOPRESSIN 20 UNIT/ML IV SOLN
INTRAVENOUS | Status: AC
  Filled 2018-12-16: qty 1

## 2018-12-16 MED ORDER — ROCURONIUM BROMIDE 100 MG/10ML IV SOLN
INTRAVENOUS | Status: AC
  Filled 2018-12-16: qty 2

## 2018-12-16 MED ORDER — TRANEXAMIC ACID-NACL 1000-0.7 MG/100ML-% IV SOLN
1000.0000 mg | INTRAVENOUS | Status: DC
  Filled 2018-12-16: qty 100

## 2018-12-16 MED ORDER — FENTANYL CITRATE (PF) 100 MCG/2ML IJ SOLN
50.0000 ug | INTRAMUSCULAR | Status: DC
  Administered 2018-12-16: 50 ug via INTRAVENOUS
  Filled 2018-12-16: qty 2

## 2018-12-16 MED ORDER — ACETAMINOPHEN 500 MG PO TABS: 1000.0000 mg | ORAL_TABLET | Freq: Once | ORAL | Status: DC | PRN

## 2018-12-16 MED ORDER — ACETAMINOPHEN 10 MG/ML IV SOLN
INTRAVENOUS | Status: AC
  Filled 2018-12-16: qty 100

## 2018-12-16 MED ORDER — LIDOCAINE 2% (20 MG/ML) 5 ML SYRINGE
INTRAMUSCULAR | Status: DC | PRN
  Administered 2018-12-16: 60 mg via INTRAVENOUS

## 2018-12-16 SURGICAL SUPPLY — 54 items
BLADE OSCILLATING/SAGITTAL (BLADE) ×3
BLADE SAW SGTL 81X20 HD (BLADE) ×3 IMPLANT
BLADE SURG SZ11 CARB STEEL (BLADE) ×3 IMPLANT
BLADE SW THK.38XMED LNG THN (BLADE) ×1 IMPLANT
BUR OVAL 4.0 (BURR) ×3 IMPLANT
CHLORAPREP W/TINT 26ML (MISCELLANEOUS) ×3 IMPLANT
CLOSURE WOUND 1/2 X4 (GAUZE/BANDAGES/DRESSINGS) ×1
COVER SURGICAL LIGHT HANDLE (MISCELLANEOUS) ×3 IMPLANT
COVER WAND RF STERILE (DRAPES) IMPLANT
DRAPE INCISE IOBAN 66X45 STRL (DRAPES) IMPLANT
DRAPE ORTHO SPLIT 77X108 STRL (DRAPES) ×3
DRAPE SURG ORHT 6 SPLT 77X108 (DRAPES) ×1 IMPLANT
DRAPE U-SHAPE 47X51 STRL (DRAPES) ×9 IMPLANT
DRSG MEPILEX BORDER 4X4 (GAUZE/BANDAGES/DRESSINGS) ×3 IMPLANT
DRSG PAD ABDOMINAL 8X10 ST (GAUZE/BANDAGES/DRESSINGS) IMPLANT
ELECT REM PT RETURN 15FT ADLT (MISCELLANEOUS) ×3 IMPLANT
GAUZE SPONGE 4X4 12PLY STRL (GAUZE/BANDAGES/DRESSINGS) ×3 IMPLANT
GLOVE BIO SURGEON STRL SZ7.5 (GLOVE) ×3 IMPLANT
GLOVE BIOGEL PI IND STRL 8 (GLOVE) ×1 IMPLANT
GLOVE BIOGEL PI INDICATOR 8 (GLOVE) ×2
GOWN STRL REUS W/TWL LRG LVL3 (GOWN DISPOSABLE) ×3 IMPLANT
GOWN STRL REUS W/TWL XL LVL3 (GOWN DISPOSABLE) ×3 IMPLANT
KIT AC JOINT DISP (KITS) ×3 IMPLANT
KIT BASIN OR (CUSTOM PROCEDURE TRAY) IMPLANT
KIT TURNOVER KIT A (KITS) IMPLANT
MANIFOLD NEPTUNE II (INSTRUMENTS) ×3 IMPLANT
NEEDLE HYPO 25X1 1.5 SAFETY (NEEDLE) ×3 IMPLANT
NEEDLE SCORPION (NEEDLE) IMPLANT
PACK SHOULDER (CUSTOM PROCEDURE TRAY) ×3 IMPLANT
PROTECTOR NERVE ULNAR (MISCELLANEOUS) ×3 IMPLANT
RESECTOR FULL RADIUS 4.2MM (BLADE) ×3 IMPLANT
RETRIEVER SUT HEWSON (MISCELLANEOUS) ×3 IMPLANT
SLING ARM FOAM STRAP LRG (SOFTGOODS) ×3 IMPLANT
SLING ARM FOAM STRAP MED (SOFTGOODS) IMPLANT
SLING ARM IMMOBILIZER LRG (SOFTGOODS) ×6 IMPLANT
SLING ARM IMMOBILIZER MED (SOFTGOODS) IMPLANT
STRIP CLOSURE SKIN 1/2X4 (GAUZE/BANDAGES/DRESSINGS) ×2 IMPLANT
SUT ETHILON 3 0 PS 1 (SUTURE) ×3 IMPLANT
SUT FIBERWIRE 2-0 18 17.9 3/8 (SUTURE) ×9
SUT MNCRL AB 4-0 PS2 18 (SUTURE) ×3 IMPLANT
SUT PDS AB 1 CT1 27 (SUTURE) IMPLANT
SUT VIC AB 0 CT1 36 (SUTURE) ×3 IMPLANT
SUT VIC AB 2-0 CT2 27 (SUTURE) ×3 IMPLANT
SUTURE FIBERWR 2-0 18 17.9 3/8 (SUTURE) ×3 IMPLANT
SYR CONTROL 10ML LL (SYRINGE) ×3 IMPLANT
TENDON GRACILIS FROZEN (Bone Implant) ×3 IMPLANT
TENDON GRACILIS FROZEN 230-320 (Bone Implant) ×1 IMPLANT
TOWEL OR 17X26 10 PK STRL BLUE (TOWEL DISPOSABLE) ×3 IMPLANT
TOWEL OR NON WOVEN STRL DISP B (DISPOSABLE) ×3 IMPLANT
TUBING ARTHRO INFLOW-ONLY STRL (TUBING) IMPLANT
TUBING CONNECTING 10 (TUBING) ×4 IMPLANT
TUBING CONNECTING 10' (TUBING) ×2
WAND HAND CNTRL MULTIVAC 90 (MISCELLANEOUS) ×3 IMPLANT
ZIPLOOP FIXATION AC JOINT DBL (Orthopedic Implant) ×3 IMPLANT

## 2018-12-16 NOTE — Anesthesia Procedure Notes (Signed)
Procedure Name: Intubation Date/Time: 12/16/2018 12:17 PM Performed by: Eben Burow, CRNA Pre-anesthesia Checklist: Patient identified, Emergency Drugs available, Suction available, Patient being monitored and Timeout performed Patient Re-evaluated:Patient Re-evaluated prior to induction Oxygen Delivery Method: Circle system utilized Preoxygenation: Pre-oxygenation with 100% oxygen Induction Type: IV induction Ventilation: Mask ventilation without difficulty Laryngoscope Size: Mac and 4 Grade View: Grade III Tube type: Oral Tube size: 7.5 mm Number of attempts: 1 Airway Equipment and Method: Stylet Placement Confirmation: ETT inserted through vocal cords under direct vision,  positive ETCO2 and breath sounds checked- equal and bilateral Secured at: 23 cm Tube secured with: Tape Dental Injury: Teeth and Oropharynx as per pre-operative assessment

## 2018-12-16 NOTE — Transfer of Care (Signed)
Immediate Anesthesia Transfer of Care Note  Patient: Charles Kent.  Procedure(s) Performed: ACROMIO-CLAVICULAR JOINT REPAIR (Left Shoulder) SHOULDER ARTHROSCOPY WITH DISTAL CLAVICLE RESECTION (Left Shoulder)  Patient Location: PACU  Anesthesia Type:General  Level of Consciousness: awake, alert  and oriented  Airway & Oxygen Therapy: Patient Spontanous Breathing and Patient connected to face mask oxygen  Post-op Assessment: Report given to RN and Post -op Vital signs reviewed and stable  Post vital signs: Reviewed and stable  Last Vitals:  Vitals Value Taken Time  BP 139/91 12/16/2018  2:14 PM  Temp 36.5 C 12/16/2018  2:14 PM  Pulse 52 12/16/2018  2:15 PM  Resp 21 12/16/2018  2:15 PM  SpO2 98 % 12/16/2018  2:15 PM  Vitals shown include unvalidated device data.  Last Pain:  Vitals:   12/16/18 1414  PainSc: (P) 0-No pain         Complications: No apparent anesthesia complications

## 2018-12-16 NOTE — Anesthesia Postprocedure Evaluation (Signed)
Anesthesia Post Note  Patient: Charles Kent.  Procedure(s) Performed: ACROMIO-CLAVICULAR JOINT REPAIR (Left Shoulder) SHOULDER ARTHROSCOPY WITH DISTAL CLAVICLE RESECTION (Left Shoulder)     Patient location during evaluation: PACU Anesthesia Type: Regional and General Level of consciousness: awake and alert Pain management: pain level controlled Vital Signs Assessment: post-procedure vital signs reviewed and stable Respiratory status: spontaneous breathing, nonlabored ventilation, respiratory function stable and patient connected to nasal cannula oxygen Cardiovascular status: blood pressure returned to baseline and stable Postop Assessment: no apparent nausea or vomiting Anesthetic complications: no    Last Vitals:  Vitals:   12/16/18 1515 12/16/18 1530  BP: (!) 126/92 (!) 117/92  Pulse: 80 65  Resp: 16 16  Temp:  36.5 C  SpO2: 95% 92%    Last Pain:  Vitals:   12/16/18 1530  PainSc: Asleep                 Jessicalynn Deshong

## 2018-12-16 NOTE — H&P (Signed)
Charles Kent. is an 63 y.o. male.   Chief Complaint: Left shoulder high-grade AC separation with continued pain and instability HPI: Patient is a 63 year old gentleman who is now 3 months out from high-grade [facility] separation with continued symptoms of pain instability which have not scarred and with conservative management with sling.  Indicated at this time for reconstruction of the coracoclavicular ligaments and distal clavicle excision to restore stability and decrease pain.  Past Medical History:  Diagnosis Date  . Anticoagulant long-term use   . Cardiomyopathy (HCC)   . 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 Dyspnea   . Dysrhythmia    Atrial Fibrillation  . GERD (gastroesophageal reflux disease)   . H/O hiatal hernia   . Obesity   . Persistent atrial fibrillation 01/05/2017   CHA2DS2VASC score 2 Diagnosis February 2018  . Renal stones   . Sleep apnea    no CPAP, has nerve generator for sleep apnea    Past Surgical History:  Procedure Laterality Date  . APPENDECTOMY    . CARDIOVERSION N/A 01/08/2017   Procedure: CARDIOVERSION;  Surgeon: Othella Boyer, MD;  Location: [facility] ENDOSCOPY;  Service: Cardiovascular;  Laterality: N/A;  . EYE SURGERY     Cataract right with Lens  . HERNIA REPAIR     Right x 2 2004, 2005  . NASAL SEPTOPLASTY W/ TURBINOPLASTY  12/24/2011   Procedure: NASAL SEPTOPLASTY WITH TURBINATE REDUCTION;  Surgeon: Keturah Barre, MD;  Location: [facility] OR;  Service: ENT;  Laterality: Bilateral;  . TEE WITHOUT CARDIOVERSION N/A 08/28/2017   Procedure: TRANSESOPHAGEAL ECHOCARDIOGRAM (TEE);  Surgeon: Quintella Reichert, MD;  Location:  [facility] ENDOSCOPY;  Service: Cardiovascular;  Laterality: N/A;  . UVULOPALATOPHARYNGOPLASTY  12/24/2011   Procedure: UVULOPALATOPHARYNGOPLASTY (UPPP);  Surgeon: Keturah Barre, MD;  Location: [facility] OR;  Service: ENT;  Laterality: Bilateral;  SEPTAL RECONSTRUCTION  ANB BILATERIAL TURBINATE REDUCTION    Family History  Problem Relation Age of Onset  . Heart disease Mother   . Cancer - Other Father        kidney  . Anesthesia problems Neg Hx    Social History:  reports that he quit smoking about 13 years ago. His smoking use included cigarettes. He has a 32.00 pack-year smoking history. He has never used smokeless tobacco. He reports that he does not drink alcohol or use drugs.  Allergies: No Known Allergies  Medications Prior to Admission  Medication Sig Dispense Refill  . apixaban (ELIQUIS) 5 MG TABS tablet Take 5 mg by mouth 2 (two) times daily.     Marland Kitchen ibuprofen (ADVIL,MOTRIN) 200 MG tablet Take 400-800 mg by mouth every 6 (six) hours as needed for moderate pain.     Marland Kitchen lisinopril (PRINIVIL,ZESTRIL) 2.5 MG tablet Take 2.5 mg by mouth daily.     . metoprolol succinate (TOPROL XL) 50 MG 24 hr tablet Take 1 tablet (50 mg total) by mouth 2 (two) times daily. Take with or immediately following a meal. 180 tablet 3  . OVER THE COUNTER MEDICATION Take 2 capsules by mouth daily. KGX: Ketosis Weight Loss Supplement      Results for orders placed or performed during the hospital encounter of 12/14/18 (from the past 48 hour(s))  CBC     Status: None   Collection Time: 12/14/18 11:07 AM  Result Value Ref Range   WBC 4.8 4.0 - 10.5 K/uL   RBC 5.07 4.22 - 5.81  MIL/uL   Hemoglobin 16.5 13.0 - 17.0 g/dL   HCT 42.5 95.6 - 38.7 %   MCV 99.6 80.0 - 100.0 fL   MCH 32.5 26.0 - 34.0 pg   MCHC 32.7 30.0 - 36.0 g/dL   RDW 56.4 33.2 - 95.1 %   Platelets 157 150 - 400 K/uL   nRBC 0.0 0.0 - 0.2 %    Comment: Performed at [facility], [address], [place], [place] [address]  Surgical pcr screen     Status: None   Collection Time: 12/14/18 11:07 AM  Result Value Ref Range   MRSA, PCR NEGATIVE NEGATIVE   Staphylococcus aureus NEGATIVE NEGATIVE    Comment: (NOTE) The Xpert SA Assay (FDA approved for NASAL specimens in patients 22 years of  age and older), is one component of a comprehensive surveillance program. It is not intended to diagnose infection nor to guide or monitor treatment. Performed at [facility], [address] [place], [place] [address]    No results found.  Review of Systems  All other systems reviewed and are negative.   Height 6' (1.829 m), weight 106.4 kg. Physical Exam  Constitutional: He is oriented to person, place, and time. He appears well-developed and well-nourished.  HENT:  Head: Atraumatic.  Eyes: EOM are normal.  Cardiovascular: Intact distal pulses.  Respiratory: Effort normal.  Musculoskeletal:     Comments: Left shoulder with significant prominence of the distal clavicle which is grossly unstable anterior posterior and inferior.  Distally is grossly neurovascularly intact.  Neurological: He is alert and oriented to person, place, and time.  Skin: Skin is warm and dry.  Psychiatric: He has a normal mood and affect.     Assessment/Plan Patient is a 63 year old gentleman who is now 3 months out from high-grade [facility] separation with continued symptoms of pain instability which have not scarred and with conservative management with sling.  Indicated at this time for reconstruction of the coracoclavicular ligaments and distal clavicle excision to restore stability and decrease pain. Risks / benefits of surgery discussed Consent on chart  NPO for OR Preop antibiotics   Berline Lopes, MD 12/16/2018, 10:54 AM

## 2018-12-16 NOTE — Op Note (Signed)
Procedure(s): ACROMIO-CLAVICULAR JOINT REPAIR DISTAL CLAVICLE RESECTION Procedure Note  Charles Kent. male 63 y.o. 12/16/2018  Preoperative diagnosis: Left grade 3 AC separation with continued instability and dysfunction  Postoperative diagnosis: Same  Procedure(s) and Anesthesia Type:    * LEFT ACROMIO-CLAVICULAR JOINT REPAIR - General    * LEFT DISTAL CLAVICLE RESECTION - General  Surgeon(s) and Role:    Jones Broom, MD - Primary   Indications:  63 y.o. male s/p grade 3 AC separation with no significant healing of the ligaments at 3 months from injury with continued instability and dysfunction.  Indicated for reconstruction of the coracoacromial ligaments and distal clavicle excision to restore stability to the joint and improve function.  Surgeon: Berline Lopes   Assistants: None  Anesthesia: General endotracheal anesthesia      Procedure Detail  ACROMIO-CLAVICULAR JOINT REPAIR,  DISTAL CLAVICLE RESECTION  Findings: Biomet zip loop with gracilis allograft.  The distal clavicle was noted to be very arthritic and about 8 mm was resected.  Estimated Blood Loss:  less than 50 mL         Drains: none  Blood Given: none         Specimens: none        Complications:  * No complications entered in OR log *         Disposition: PACU - hemodynamically stable.         Condition: stable    Procedure:  DESCRIPTION OF PROCEDURE: The patient was identified in preoperative  holding area where I personally marked the operative site after  verifying site, side, and procedure with the patient. The patient was taken back  to the operating room where general anesthesia was induced without  complication and was placed in the beach-chair position with the back  elevated about 40 degrees and all extremities carefully padded and  positioned.  The left upper extremity was then prepped and  draped in a standard sterile fashion. The appropriate time-out   procedure was carried out. The patient did receive IV antibiotics  within 30 minutes of incision.  An incision was made in [employer] centered over the distal clavicle extending to the coracoid.  Skin flaps were elevated and the deltotrapezial fascia was opened over the distal clavicle.  Dissection was carried down anteriorly to the superior aspect of the coracoid which was carefully exposed.  Guidepin was placed bicortically and verified in its position in the coracoid with fluoroscopy.  This was overreamed with a 4.5 mm reamer and the zip loop button was deployed and verified in position with x-ray.   The gracilis allograft was prepared on the back table and placed through the loop device and brought down to the superior aspect of the coracoid.  The distal 8 mm of the distal clavicle was excised with a soft as it was quite arthritic and after reduction could cause continued pain and problems if left arm. The 4 mm reamer was then used through the clavicle and the sutures from the button device were passed up through it.  The superior button was placed.  The gracilis allograft was brought anterior and posterior to the clavicle at the level of the button device.  The sutures were then tensioned equally and the button was brought down while holding the elbow with an upward force reducing the [facility] joint.  Fluoroscopic imaging demonstrated anatomic reduction of the joint.  At this point the chrysalis allograft was tied to itself over top of  the construct and sewn into place with three 2-0 FiberWire sutures in a figure-of-eight fashion.  At this point copious irrigation was used and the wound was closed in layers with 0 Vicryl in the deep fascial layer, 2-0 Vicryl and 4-0 Monocryl for skin closure.  Steri-Strips were applied as well as a light sterile dressing .  He was placed in a sling.  The patient was then allowed to awaken from anesthesia transferred to the stretcher and taken to the recovery room in stable  condition.  POSTOPERATIVE PLAN: He will be observed in the recovery room and if pain is well controlled with the block can be discharged home today with his wife.  He will follow-up again with me in 2 weeks.

## 2018-12-16 NOTE — Anesthesia Procedure Notes (Signed)
Anesthesia Regional Block: Interscalene brachial plexus block   Pre-Anesthetic Checklist: ,, timeout performed, Correct Patient, Correct Site, Correct Laterality, Correct Procedure, Correct Position, site marked, Risks and benefits discussed,  Surgical consent,  Pre-op evaluation,  At surgeon's request and post-op pain management  Laterality: Left and Upper  Prep: chloraprep       Needles:  Injection technique: Single-shot     Needle Length: 5cm  Needle Gauge: 22     Additional Needles: Arrow StimuQuik ECHO Echogenic Stimulating PNB Needle  Procedures:,,,, ultrasound used (permanent image in chart),,,,  Narrative:  Start time: 12/16/2018 11:39 AM End time: 12/16/2018 11:42 AM Injection made incrementally with aspirations every 5 mL.  Performed by: Personally  Anesthesiologist: Val Eagle, MD

## 2018-12-16 NOTE — Discharge Instructions (Signed)

## 2018-12-17 ENCOUNTER — Encounter (HOSPITAL_COMMUNITY): Payer: Self-pay | Admitting: Orthopedic Surgery

## 2019-02-07 ENCOUNTER — Other Ambulatory Visit: Payer: Self-pay

## 2019-02-08 ENCOUNTER — Other Ambulatory Visit: Payer: Self-pay

## 2019-02-08 MED ORDER — LISINOPRIL 2.5 MG PO TABS: 2.5000 mg | ORAL_TABLET | Freq: Every day | ORAL | 3 refills | Status: DC

## 2019-02-24 ENCOUNTER — Telehealth: Payer: Self-pay

## 2019-02-24 NOTE — Telephone Encounter (Signed)
Called pt to change OV to Virtual visit with MS on 5/26 at 10:20. Also need to document consent.

## 2019-03-01 ENCOUNTER — Ambulatory Visit: Payer: Medicare Other | Admitting: Cardiology

## 2019-03-02 ENCOUNTER — Other Ambulatory Visit: Payer: Self-pay

## 2019-03-02 ENCOUNTER — Telehealth (INDEPENDENT_AMBULATORY_CARE_PROVIDER_SITE_OTHER): Payer: Medicare Other | Admitting: Cardiology

## 2019-03-02 ENCOUNTER — Encounter: Payer: Self-pay | Admitting: Cardiology

## 2019-03-02 VITALS — Ht 72.0 in | Wt 250.0 lb

## 2019-03-02 DIAGNOSIS — I4811 Longstanding persistent atrial fibrillation: Secondary | ICD-10-CM | POA: Diagnosis not present

## 2019-03-02 DIAGNOSIS — I110 Hypertensive heart disease with heart failure: Secondary | ICD-10-CM

## 2019-03-02 DIAGNOSIS — Z7901 Long term (current) use of anticoagulants: Secondary | ICD-10-CM

## 2019-03-02 DIAGNOSIS — I5022 Chronic systolic (congestive) heart failure: Secondary | ICD-10-CM | POA: Diagnosis not present

## 2019-03-02 DIAGNOSIS — I429 Cardiomyopathy, unspecified: Secondary | ICD-10-CM | POA: Diagnosis not present

## 2019-03-02 DIAGNOSIS — I519 Heart disease, unspecified: Secondary | ICD-10-CM

## 2019-03-02 DIAGNOSIS — I4510 Unspecified right bundle-branch block: Secondary | ICD-10-CM

## 2019-03-02 DIAGNOSIS — Z6833 Body mass index (BMI) 33.0-33.9, adult: Secondary | ICD-10-CM

## 2019-03-02 DIAGNOSIS — G4733 Obstructive sleep apnea (adult) (pediatric): Secondary | ICD-10-CM

## 2019-03-02 DIAGNOSIS — E669 Obesity, unspecified: Secondary | ICD-10-CM

## 2019-03-02 NOTE — Telephone Encounter (Signed)
Virtual Visit Pre-Appointment Phone Call  TELEPHONE CALL NOTE  Charles Sisco. has been deemed a candidate for a follow-up tele-health visit to limit community exposure during the Covid-19 pandemic. I spoke with the patient via phone to ensure availability of phone/video source, confirm preferred email & phone number, and discuss instructions and expectations.  I reminded Charles Ayson. to be prepared with any vital sign and/or heart rhythm information that could potentially be obtained via home monitoring, at the time of his visit. I reminded Charles Kent. to expect a phone call prior to his visit.  Patient agrees to consent below.  Lattie Haw, RN 03/02/2019 8:49 AM   FULL LENGTH CONSENT FOR TELE-HEALTH VISIT   I hereby voluntarily request, consent and authorize [facility] and its employed or contracted physicians, physician assistants, nurse practitioners or other licensed health care professionals (the Practitioner), to provide me with telemedicine health care services (the "Services") as deemed necessary by the treating Practitioner. I acknowledge and consent to receive the Services by the Practitioner via telemedicine. I understand that the telemedicine visit will involve communicating with the Practitioner through live audiovisual communication technology and the disclosure of certain medical information by electronic transmission. I acknowledge that I have been given the opportunity to request an in-person assessment or other available alternative prior to the telemedicine visit and am voluntarily participating in the telemedicine visit.  I understand that I have the right to withhold or withdraw my consent to the use of telemedicine in the course of my care at any time, without affecting my right to future care or treatment, and that the Practitioner or I may terminate the telemedicine visit at any time. I understand that I have the right to inspect all  information obtained and/or recorded in the course of the telemedicine visit and may receive copies of available information for a reasonable fee.  I understand that some of the potential risks of receiving the Services via telemedicine include:  Marland Kitchen Delay or interruption in medical evaluation due to technological equipment failure or disruption; . Information transmitted may not be sufficient (e.g. poor resolution of images) to allow for appropriate medical decision making by the Practitioner; and/or  . In rare instances, security protocols could fail, causing a breach of personal health information.  Furthermore, I acknowledge that it is my responsibility to provide information about my medical history, conditions and care that is complete and accurate to the best of my ability. I acknowledge that Practitioner's advice, recommendations, and/or decision may be based on factors not within their control, such as incomplete or inaccurate data provided by me or distortions of diagnostic images or specimens that may result from electronic transmissions. I understand that the practice of medicine is not an exact science and that Practitioner makes no warranties or guarantees regarding treatment outcomes. I acknowledge that I will receive a copy of this consent concurrently upon execution via email to the email address I last provided but may also request a printed copy by calling the office of [facility].    I understand that my insurance will be billed for this visit.   I have read or had this consent read to me. . I understand the contents of this consent, which adequately explains the benefits and risks of the Services being provided via telemedicine.  . I have been provided ample opportunity to ask questions regarding this consent and the Services and have had my questions answered to my  satisfaction. . I give my informed consent for the services to be provided through the use of telemedicine in my  medical care  By participating in this telemedicine visit I agree to the above.
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
## 2019-03-02 NOTE — Patient Instructions (Signed)
Medication Instructions:  Your physician recommends that you continue on your current medications as directed. Please refer to the Current Medication list given to you today.  If you need a refill on your cardiac medications before your next appointment, please call your pharmacy.   Lab work: None ordered  If you have labs (blood work) drawn today and your tests are completely normal, you will receive your results only by: . MyChart Message (if you have MyChart) OR . A paper copy in the mail If you have any lab test that is abnormal or we need to change your treatment, we will call you to review the results.  Testing/Procedures: None ordered  Follow-Up: At [facility], you and your health needs are our priority.  As part of our continuing mission to provide you with exceptional heart care, we have created designated Provider Care Teams.  These Care Teams include your primary Cardiologist (physician) and Advanced Practice Providers (APPs -  Physician Assistants and Nurse Practitioners) who all work together to provide you with the care you need, when you need it. You will need a follow up appointment in 6 months.  Please call our office 2 months in advance to schedule this appointment.  You may see Mark Skains, MD or one of the following Advanced Practice Providers on your designated Care Team:   Lori Gerhardt, NP Laura Ingold, NP . Jill McDaniel, NP  Any Other Special Instructions Will Be Listed Below (If Applicable).    

## 2019-03-03 ENCOUNTER — Other Ambulatory Visit: Payer: Self-pay | Admitting: Cardiology

## 2019-05-30 ENCOUNTER — Encounter (HOSPITAL_COMMUNITY): Payer: Self-pay | Admitting: *Deleted

## 2019-05-30 ENCOUNTER — Ambulatory Visit (HOSPITAL_COMMUNITY): Payer: Medicare Other | Admitting: Nurse Practitioner

## 2019-12-08 ENCOUNTER — Ambulatory Visit: Payer: Medicare Other | Attending: Internal Medicine

## 2019-12-08 ENCOUNTER — Encounter: Payer: Self-pay | Admitting: Internal Medicine

## 2019-12-08 DIAGNOSIS — Z23 Encounter for immunization: Secondary | ICD-10-CM | POA: Insufficient documentation

## 2019-12-08 NOTE — Telephone Encounter (Signed)
error 

## 2019-12-08 NOTE — Progress Notes (Signed)
   [id number]  Name:  Charles Kent.    MRN: [id number] DOB: 10/01/1956  12/08/2019  Charles Kent was observed post Covid-19 immunization for 15 minutes without incident. He was provided with Vaccine Information Sheet and instruction to access the V-Safe system.   Charles Kent was instructed to call 911 with any severe reactions post vaccine: Marland Kitchen Difficulty breathing  . Swelling of face and throat  . A fast heartbeat  . A bad rash all over body  . Dizziness and weakness   Immunizations Administered    Name Date Dose VIS Date Route   Pfizer COVID-19 Vaccine 12/08/2019  9:46 AM 0.3 mL 09/16/2019 Intramuscular   Manufacturer: [employer], [employer]   Lot: [id number]   NDC: [address]-2

## 2019-12-29 ENCOUNTER — Ambulatory Visit: Payer: Medicare Other | Attending: Internal Medicine

## 2019-12-29 DIAGNOSIS — Z23 Encounter for immunization: Secondary | ICD-10-CM

## 2019-12-29 NOTE — Progress Notes (Signed)
   [id number]  Name:  Daylen Lipsky.    MRN: [id number] DOB: [birth year]  12/29/2019  Mr. Kelleher was observed post Covid-19 immunization for 15 minutes without incident. He was provided with Vaccine Information Sheet and instruction to access the V-Safe system.   Mr. Yniguez was instructed to call 911 with any severe reactions post vaccine: Marland Kitchen Difficulty breathing  . Swelling of face and throat  . A fast heartbeat  . A bad rash all over body  . Dizziness and weakness   Immunizations Administered    Name Date Dose VIS Date Route   Pfizer COVID-19 Vaccine 12/29/2019  9:18 AM 0.3 mL 09/16/2019 Intramuscular   Manufacturer: [employer], [employer]   Lot: [id number]   NDC: [address]-0

## 2020-01-15 IMAGING — CR DG CHEST 2V
2 series · 2 of 2 positions shown · non-contrast
Comparison: Radiograph 01/05/2017

CLINICAL DATA: Preop for shoulder surgery.

EXAM:
CHEST - 2 VIEW

[w chest pa]
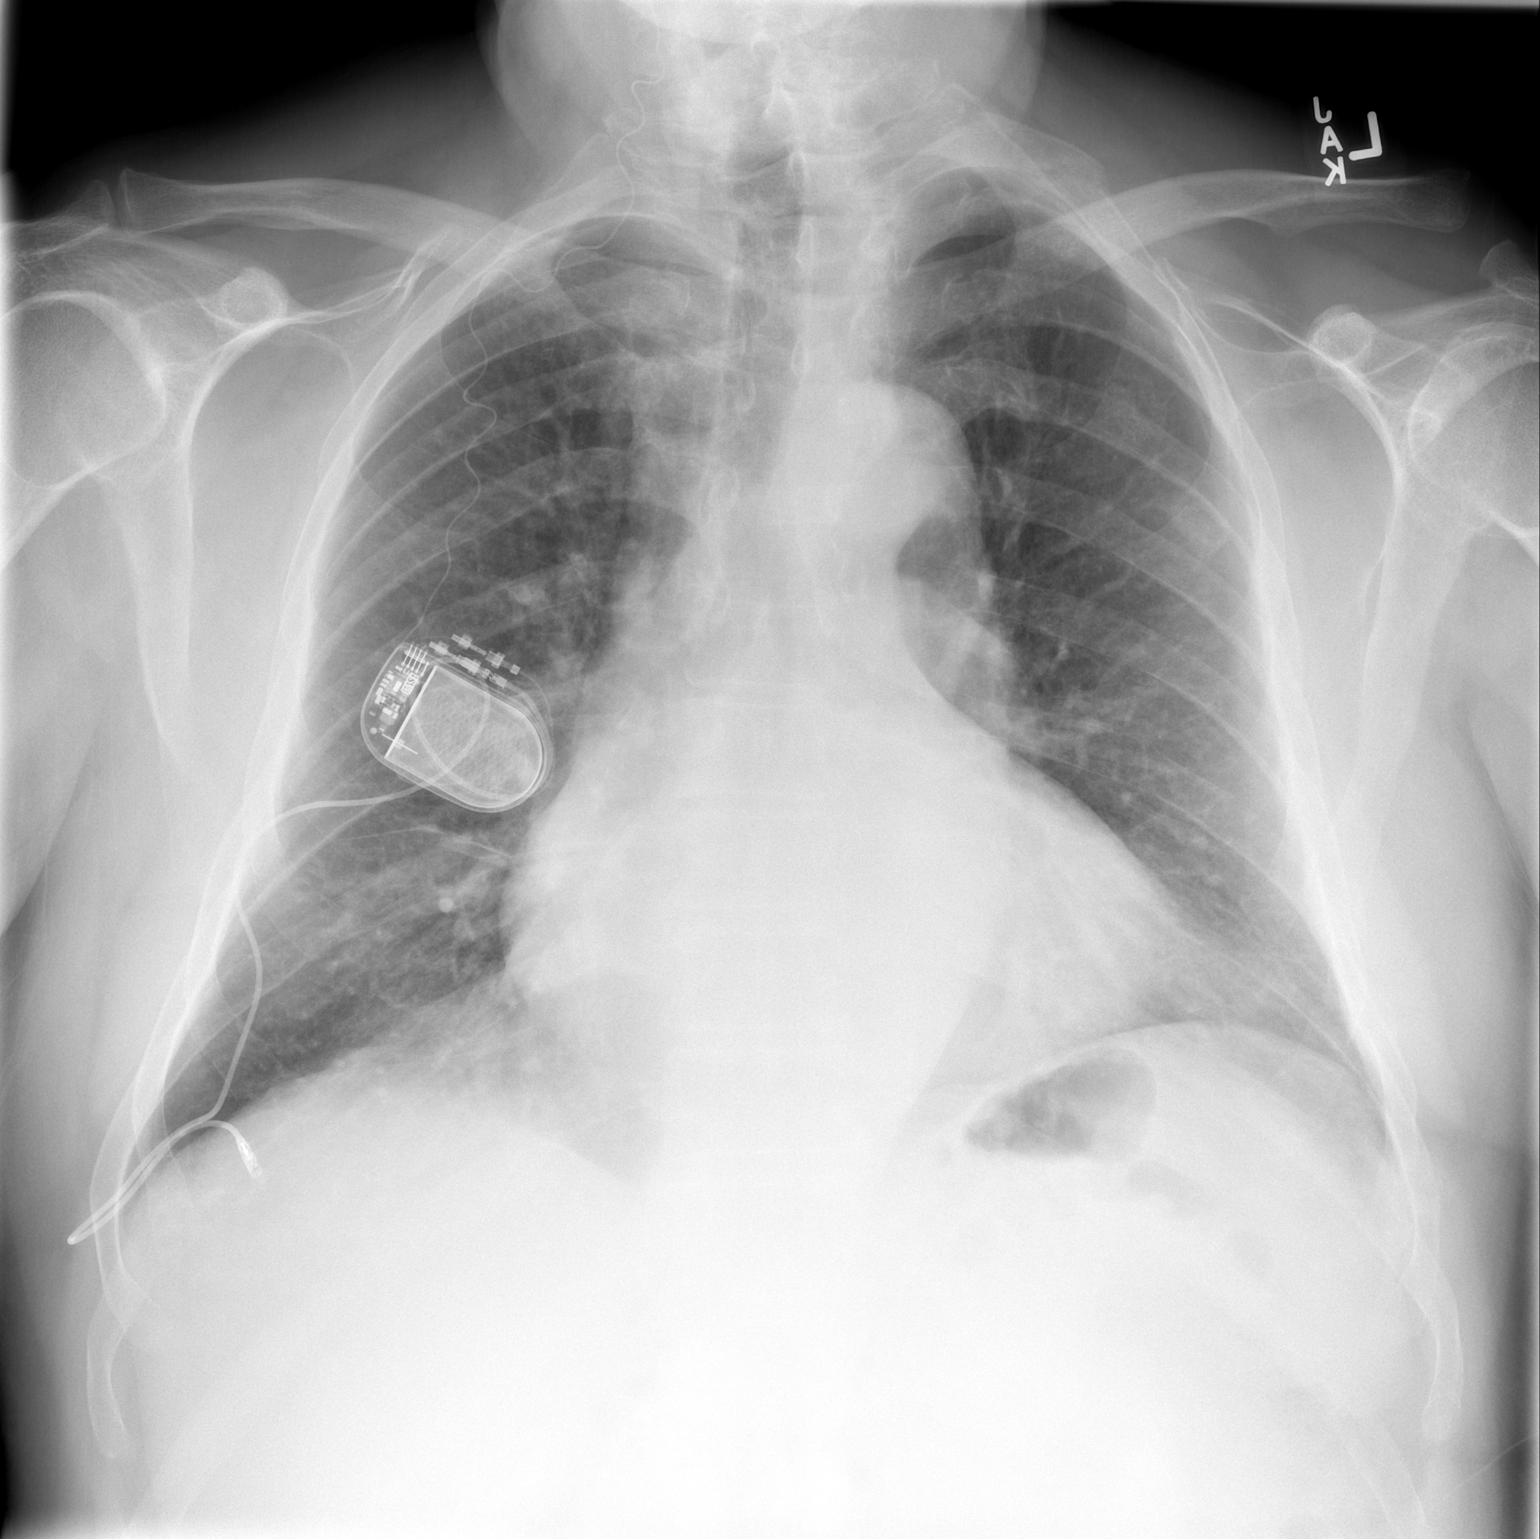

[w chest lat]
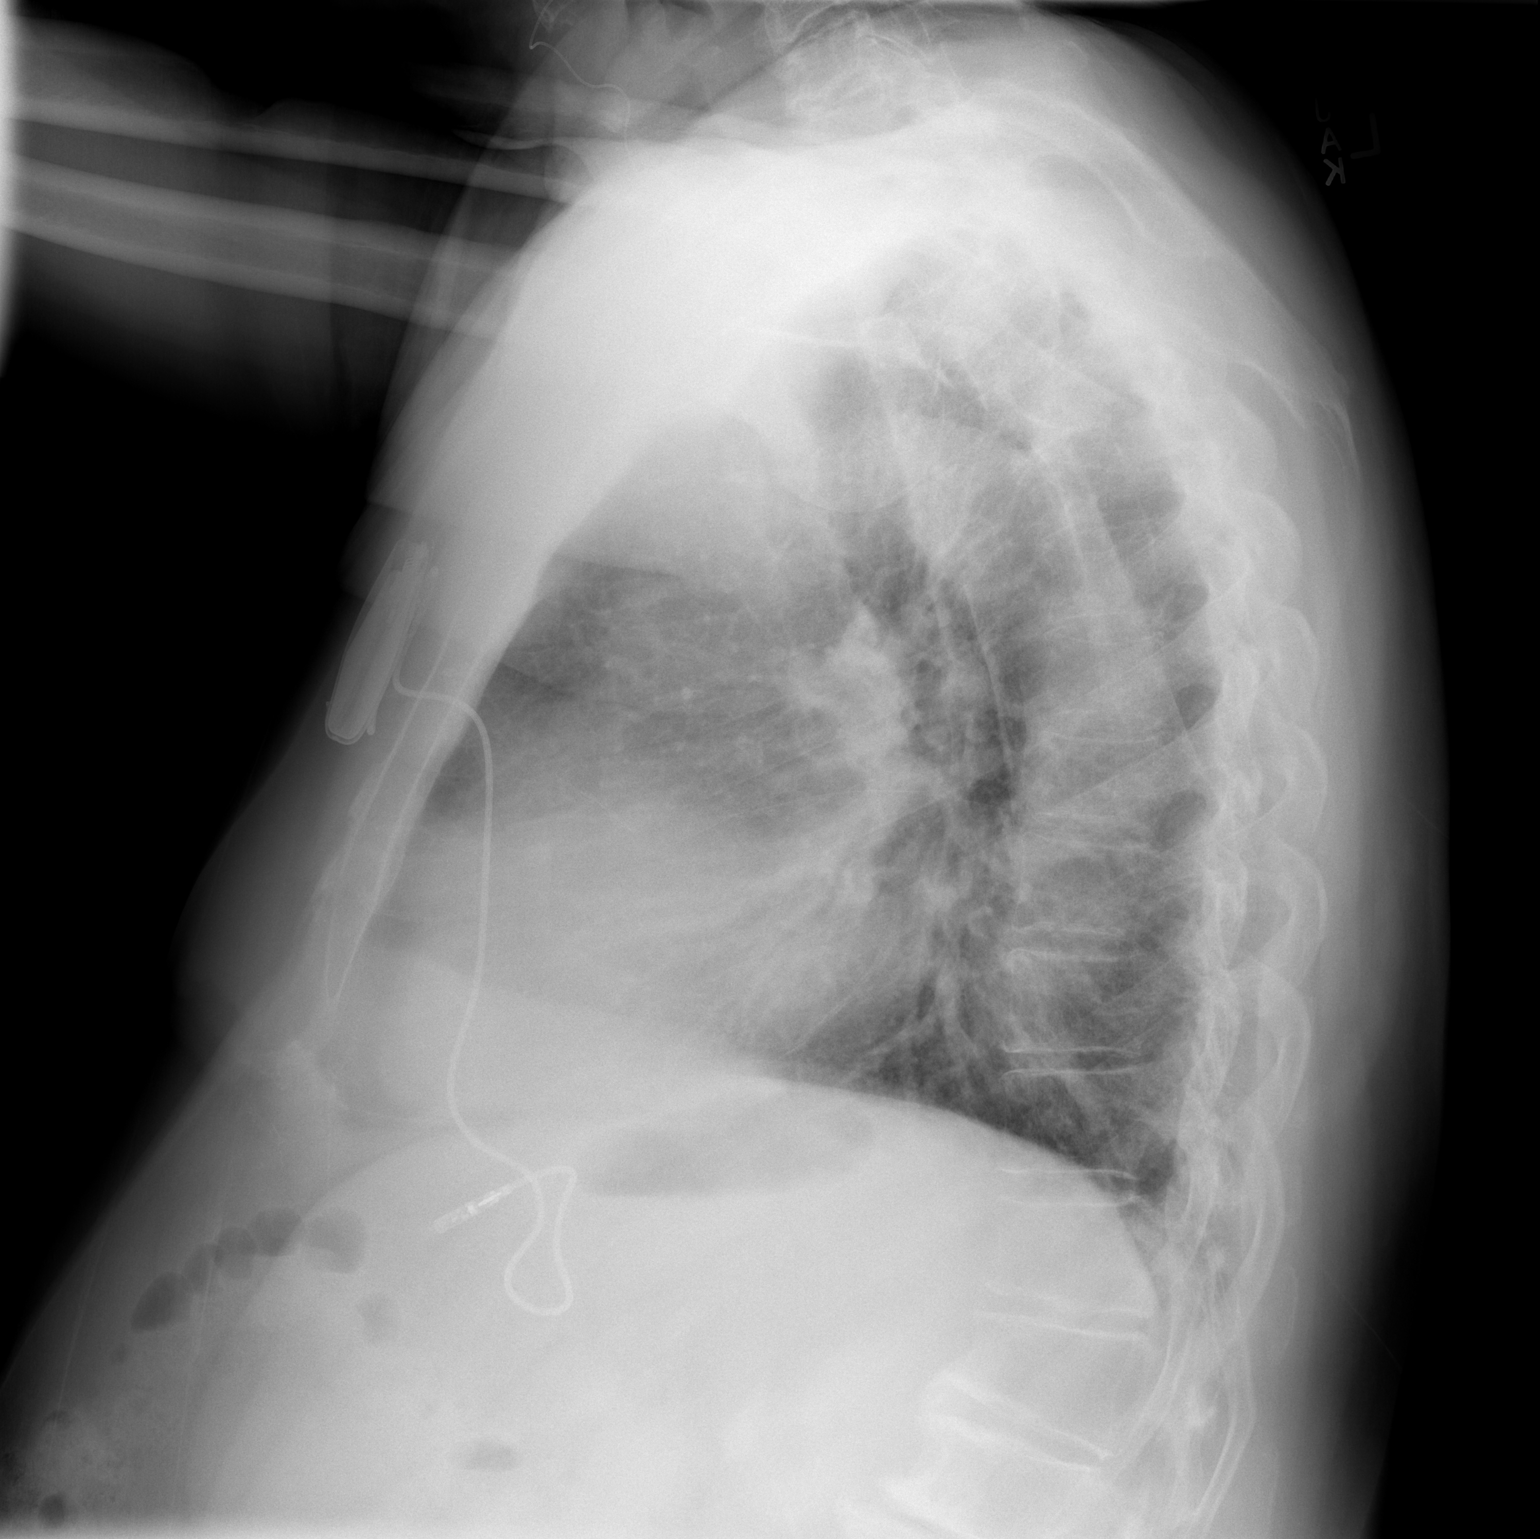

[2 of 2 positions shown; findings below may reference images not displayed]

FINDINGS: Battery pack projects over the right chest wall with lead coursing
into the neck, presumed vagal stimulator. An additional lead
projects inferiorly terminating over the diaphragm/right upper
quadrant. Cardiomegaly is unchanged from prior exams. Unchanged
mediastinal contours with aortic tortuosity. No pulmonary edema,
focal airspace disease, pleural effusion, or pneumothorax. No acute
osseous abnormalities. Remote left rib fractures.
IMPRESSION: Chronic cardiomegaly without acute abnormality.

## 2020-01-23 IMAGING — RF LEFT CLAVICLE - 2+ VIEWS
1 series · 3 of 3 positions shown · non-contrast
Comparison: CT left shoulder 10/12/2018

CLINICAL DATA: Shoulder surgery.

EXAM:
LEFT CLAVICLE - 2+ VIEWS

[Series 1: unknown protocol · 0.14mm/px · 3 of 3 slices shown]
[im 1/3]
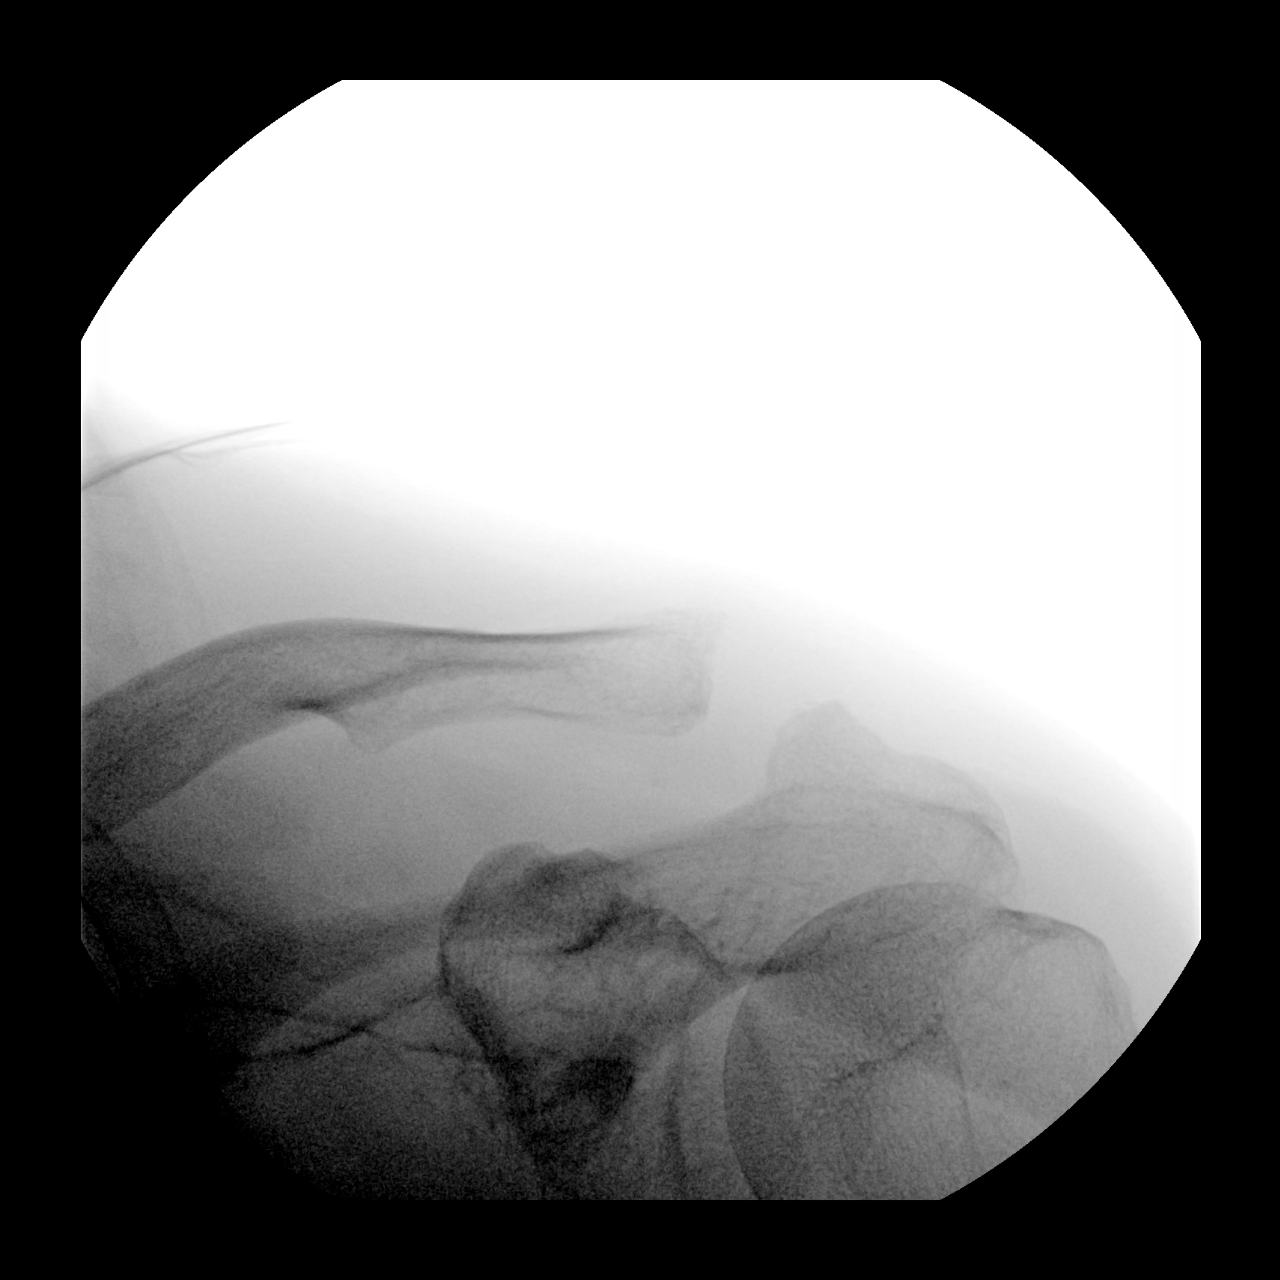
[im 2/3]
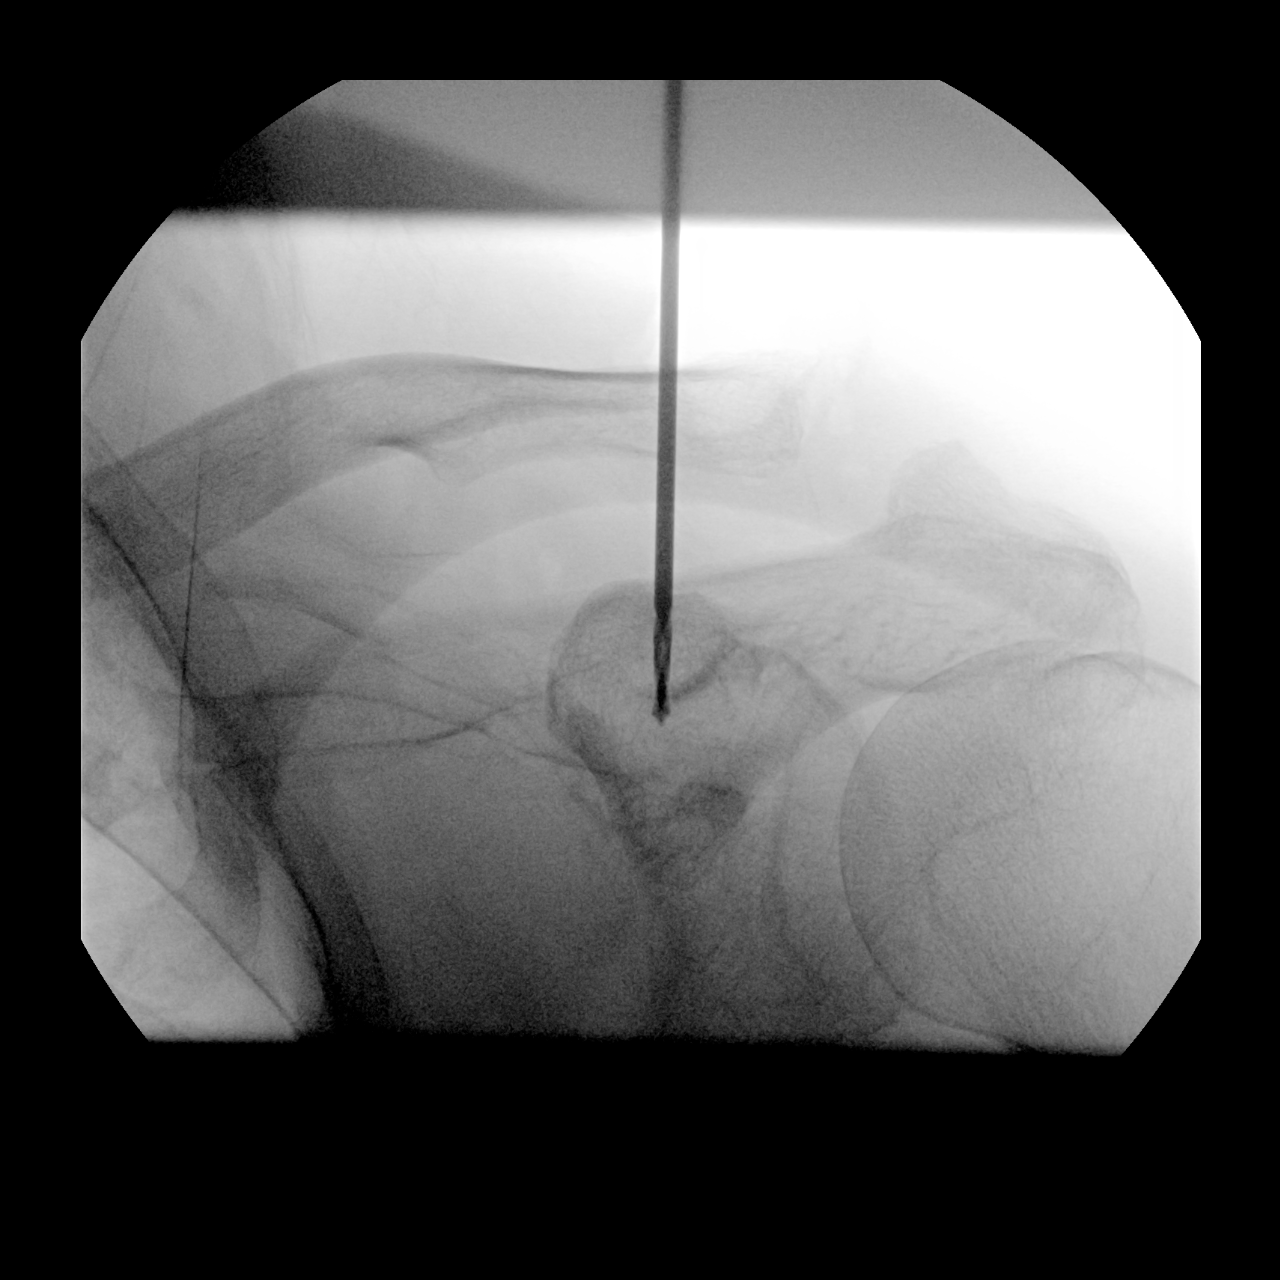
[im 3/3]
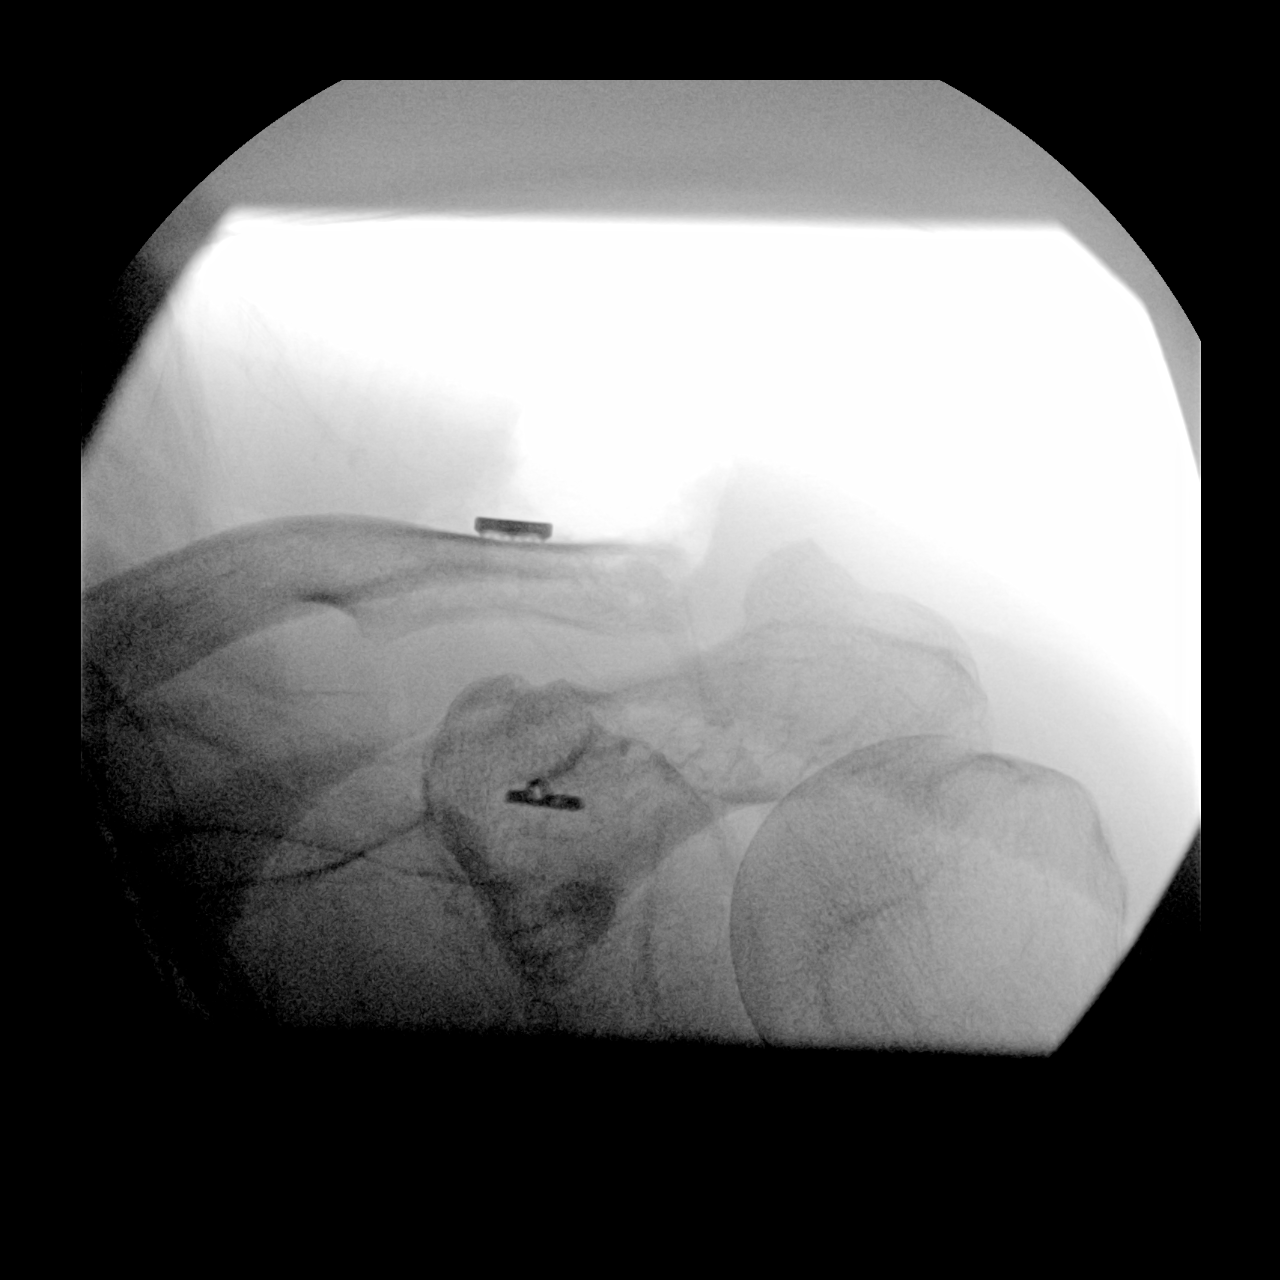

[3 of 3 positions shown; findings below may reference images not displayed]

FINDINGS: C-arm images were obtained of the left shoulder. AC separation is
present. Limited bony detail. AC separation reduced with
coracoclavicular fiber wire fixation.
IMPRESSION: Coraclavicular fixation with fiber wire.

## 2020-02-18 ENCOUNTER — Other Ambulatory Visit: Payer: Self-pay | Admitting: Internal Medicine

## 2020-02-20 MED ORDER — METOPROLOL SUCCINATE ER 50 MG PO TB24: 50.0000 mg | ORAL_TABLET | Freq: Two times a day (BID) | ORAL | 0 refills | Status: DC

## 2020-03-01 ENCOUNTER — Other Ambulatory Visit: Payer: Self-pay

## 2020-03-01 ENCOUNTER — Encounter: Payer: Self-pay | Admitting: Cardiology

## 2020-03-01 ENCOUNTER — Ambulatory Visit: Payer: Medicare Other | Admitting: Cardiology

## 2020-03-01 VITALS — BP 126/90 | HR 87 | Ht 72.0 in | Wt 226.4 lb

## 2020-03-01 DIAGNOSIS — I4811 Longstanding persistent atrial fibrillation: Secondary | ICD-10-CM

## 2020-03-01 DIAGNOSIS — I482 Chronic atrial fibrillation, unspecified: Secondary | ICD-10-CM

## 2020-03-01 DIAGNOSIS — I4819 Other persistent atrial fibrillation: Secondary | ICD-10-CM

## 2020-03-01 DIAGNOSIS — I519 Heart disease, unspecified: Secondary | ICD-10-CM | POA: Diagnosis not present

## 2020-03-01 DIAGNOSIS — G4733 Obstructive sleep apnea (adult) (pediatric): Secondary | ICD-10-CM | POA: Diagnosis not present

## 2020-03-01 LAB — CBC
Hematocrit: 50.9 % (ref 37.5–51.0)
Hemoglobin: 17.3 g/dL (ref 13.0–17.7)
MCH: 33.3 pg — ABNORMAL HIGH (ref 26.6–33.0)
MCHC: 34 g/dL (ref 31.5–35.7)
MCV: 98 fL — ABNORMAL HIGH (ref 79–97)
Platelets: 187 10*3/uL (ref 150–450)
RBC: 5.2 x10E6/uL (ref 4.14–5.80)
RDW: 13.8 % (ref 11.6–15.4)
WBC: 6.1 10*3/uL (ref 3.4–10.8)

## 2020-03-01 LAB — BASIC METABOLIC PANEL
BUN/Creatinine Ratio: 23 (ref 10–24)
BUN: 20 mg/dL (ref 8–27)
CO2: 26 mmol/L (ref 20–29)
Calcium: 9.4 mg/dL (ref 8.6–10.2)
Chloride: 102 mmol/L (ref 96–106)
Creatinine, Ser: 0.88 mg/dL (ref 0.76–1.27)
GFR calc Af Amer: 103 mL/min/{1.73_m2} (ref >59)
GFR calc non Af Amer: 90 mL/min/{1.73_m2} (ref >59)
Glucose: 98 mg/dL (ref 65–99)
Potassium: 4.8 mmol/L (ref 3.5–5.2)
Sodium: 142 mmol/L (ref 134–144)

## 2020-03-01 LAB — LIPID PANEL
Chol/HDL Ratio: 3.6 ratio (ref 0.0–5.0)
Cholesterol, Total: 179 mg/dL (ref 100–199)
HDL: 50 mg/dL (ref >39)
LDL Chol Calc (NIH): 110 mg/dL — ABNORMAL HIGH (ref 0–99)
Triglycerides: 102 mg/dL (ref 0–149)
VLDL Cholesterol Cal: 19 mg/dL (ref 5–40)

## 2020-03-01 LAB — ALT: ALT: 9 IU/L (ref 0–44)

## 2020-03-01 MED ORDER — BISOPROLOL FUMARATE 5 MG PO TABS: 2.5000 mg | ORAL_TABLET | Freq: Every day | ORAL | 1 refills | Status: DC
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
## 2020-03-01 NOTE — Patient Instructions (Addendum)
Medication Instructions:   STOP TAKING YOUR METOPROLOL SUCCINATE (TOPROL XL) NOW  START TAKING BISOPROLOL 2.5 MG BY MOUTH DAILY--DR. SKAINS SAID YOU MAY TAKE THIS AT NIGHT IF YOU PREFER  *If you need a refill on your cardiac medications before your next appointment, please call your pharmacy*   Lab:  TODAY--BMET, CBC, ALT, AND LIPIDS    Follow-Up:  ONE MONTH IN THE OFFICE WITH Nada Boozer NP

## 2020-03-02 ENCOUNTER — Telehealth: Payer: Self-pay

## 2020-03-02 NOTE — Telephone Encounter (Signed)
-----   Message from Jake Bathe, MD sent at 03/02/2020  8:48 AM EDT ----- Normal kidney function normal electrolytes, no signs of anemia.  Cholesterol levels are good.  Liver function normal. Donato Schultz, MD

## 2020-03-02 NOTE — Telephone Encounter (Signed)
Called the patient who did not answer-unable to LM due to mailbox being full

## 2020-03-08 ENCOUNTER — Encounter: Payer: Self-pay | Admitting: *Deleted

## 2020-03-17 ENCOUNTER — Other Ambulatory Visit: Payer: Self-pay | Admitting: Internal Medicine

## 2020-03-19 NOTE — Telephone Encounter (Signed)
Chronic systolic heart failure -Stopping Toprol because of perceived side effects of fatigue as well as erectile dysfunction.  We will try bisoprolol 2.5 mg once a day which can also help with his atrial fibrillation.  In 1 month, he will come back in and see APP, if he is still feeling the side effects, neck step would be to try carvedilol 6.25 twice a day.  After that, we could consider changing classes over to diltiazem however this is not indicated in heart failure.  I explained to him

## 2020-03-21 ENCOUNTER — Telehealth: Payer: Self-pay | Admitting: *Deleted

## 2020-03-21 MED ORDER — APIXABAN 5 MG PO TABS: 5.0000 mg | ORAL_TABLET | Freq: Two times a day (BID) | ORAL | 10 refills | Status: AC

## 2020-03-21 NOTE — Telephone Encounter (Signed)
Eliquis 5mg  paper refill request received. Patient is 64 years old, weight-102.7kg, Crea-0.88 on 03/01/2020, Diagnosis-Afib, and last seen by [facility] on 03/01/2020. Dose is appropriate based on dosing criteria. Will send in refill to requested pharmacy.
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
## 2020-04-05 ENCOUNTER — Ambulatory Visit: Payer: Medicare Other | Admitting: Cardiology

## 2020-07-27 ENCOUNTER — Other Ambulatory Visit: Payer: Self-pay | Admitting: Internal Medicine

## 2021-02-04 ENCOUNTER — Other Ambulatory Visit: Payer: Self-pay | Admitting: Cardiology

## 2021-03-03 ENCOUNTER — Other Ambulatory Visit: Payer: Self-pay | Admitting: Cardiology

## 2021-06-25 ENCOUNTER — Telehealth: Payer: Self-pay | Admitting: *Deleted

## 2021-06-25 NOTE — Telephone Encounter (Signed)
   [facility] Pre-operative Risk Assessment    Patient Name: Charles Kent.  DOB: 12-Nov-1955 MRN: [id number]  HEARTCARE STAFF:  - IMPORTANT!!!!!! Under Visit Info/Reason for Call, type in Other and utilize the format Clearance MM/DD/YY or Clearance TBD. Do not use dashes or single digits. - Please review there is not already an duplicate clearance open for this procedure. - If request is for dental extraction, please clarify the # of teeth to be extracted. - If the patient is currently at the dentist's office, call Pre-Op Callback Staff (MA/nurse) to input urgent request.  - If the patient is not currently in the dentist office, please route to the Pre-Op pool.  Request for surgical clearance:  What type of surgery is being performed?  BILATERAL L3/L4/L5 LUMBAR RADIOFREQUENCY ABLATIOIN  When is this surgery scheduled?  TBD  What type of clearance is required (medical clearance vs. Pharmacy clearance to hold med vs. Both)?  BOTH  Are there any medications that need to be held prior to surgery and how long?  ELIQUIS X'S 2 DAYS  Practice name and name of physician performing surgery?  [facility] / DR. Mina Marble   What is the office phone number?  [phone]   7.   What is the office fax number?  [phone] ATTN:  SANDRA   8.   Anesthesia type (None, local, MAC, general) ?  LOCAL    Jeanann Lewandowsky 06/25/2021, 2:44 PM  _________________________________________________________________   (provider comments below)

## 2021-06-26 NOTE — Telephone Encounter (Signed)
Patient with diagnosis of afib on Eliquis for anticoagulation.    Procedure: BILATERAL L3/L4/L5 LUMBAR RADIOFREQUENCY ABLATION Date of procedure: TBD  CHA2DS2-VASc Score = 3  This indicates a 3.2% annual risk of stroke. The patient's score is based upon: CHF History: 1 HTN History: 1 Diabetes History: 0 Stroke History: 0 Vascular Disease History: 0 Age Score: 1 Gender Score: 0   Hasn't had labs checked in > 1 year. Will need BMET and CBC checked at upcoming cards appt.  If labs are stable and no new additional risk factors are noted, pt can hold Eliquis for 3 days prior to spinal procedure per protocol.

## 2021-06-26 NOTE — Telephone Encounter (Signed)
Noted. Pharm info now available for provider seeing patient. Callback has reached out to patient to schedule appt, they're awaiting callback. Will remove from preop APP box.

## 2021-06-26 NOTE — Telephone Encounter (Signed)
   Name: Charles Kent.  DOB: 1956/03/30  MRN: [id number]  Primary Cardiologist: Donato Schultz, MD  Chart reviewed as part of pre-operative protocol coverage. Because of Charles FALERO Jr.'s past medical history and time since last visit, he will require a follow-up visit in order to better assess preoperative cardiovascular risk.  Last OV was >1 year ago in 02/2020.  Pre-op covering staff: - Please schedule appointment and call patient to inform them. Does not have any active appts pending. - Please contact requesting surgeon's office via preferred method (i.e, phone, fax) to inform them of need for appointment prior to surgery.  This message will also be routed to pharmacy pool for input on holding anticoagulant/antiplatelet agent as requested below so that this information is available to the clearing provider at time of patient's appointment. Pharm team - no need to route back to preop pool when done.  Laurann Montana, PA-C  06/26/2021, 10:02 AM

## 2021-06-26 NOTE — Telephone Encounter (Signed)
1st attempt to reach pt regarding surgical clearance and the need for an appointment.  Voicemail is full and couldn't leave a message.  If pt calls back, please schedule pt with 1st available with Dr. Anne Fu or an APP.

## 2021-06-27 NOTE — Telephone Encounter (Signed)
Tried to reach pt to schedule an appt for pre op clearance. Vm is full could not leave message.

## 2021-06-28 ENCOUNTER — Encounter: Payer: Self-pay | Admitting: *Deleted

## 2021-06-28 NOTE — Telephone Encounter (Signed)
I tried to reach pt again today and VM is still full, could not leave a message to call back. We have attempted x 3 to reach the pt to schedule an appt for pre op clearance. I will mail out a letter to the pt to call the office and make appt for pre op clearance. I will send FYI to requesting office that we have been trying to reach the pt for appt.

## 2021-07-01 NOTE — Telephone Encounter (Signed)
Tried to reach pt again to schedule an appt for pre op clearance. No answer. Letter sent out last week. I will send another FYI to requesting office pt will need appt and we have been unsuccessful in reaching the pt. I will remove from the pre op call back at this time.

## 2021-07-11 NOTE — Progress Notes (Signed)
Cardiology Office Note    Date:  07/24/2021   ID:  Charles Kent., DOB 03/25/1956, MRN [id number]   PCP:  Kaleen Mask, MD   [facility]  Cardiologist:  Donato Schultz, MD   Advanced Practice Provider:  No care team member to display Electrophysiologist:  None   [phone]   Chief Complaint  Patient presents with   Pre-op Exam     History of Present Illness:  Vedh Ptacek. is a 65 y.o. male with permanent Afib, NICM EF 35-40% on echo 11/2018, chronic systolic CHF, bifasicular block, OSA on CPAP.  Patient saw Dr. Anne Fu 02/2020 and had fatigue and ED on toprol so switched to bisoprolol.   Patient on my schedule for preop clearance for bilateral lumbar RFA by Dr. Regino Schultze. Patient says he's doing well. Denies chest pain, dyspnea, dizziness, palpitations, syncope. Sometimes he feels like he has to take a deep breath. Has a lot of sinus problems. Able to work outside but not able to exercise much with back trouble. Has some leg swelling and tingling sensation. Watches salt closely.      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   Dyspnea    Dysrhythmia    Atrial Fibrillation   GERD (gastroesophageal reflux disease)    H/O hiatal hernia    Obesity    Persistent atrial fibrillation (HCC) 01/05/2017   CHA2DS2VASC score 2 Diagnosis February 2018   Renal stones    Sleep apnea    no CPAP, has nerve generator for sleep apnea    Past Surgical History:  Procedure Laterality Date   ACROMIO-CLAVICULAR JOINT REPAIR Left 12/16/2018   Procedure: ACROMIO-CLAVICULAR JOINT REPAIR;  Surgeon: Jones Broom, MD;  Location: [facility] ORS;  Service: Orthopedics;  Laterality: Left;   APPENDECTOMY     CARDIOVERSION N/A 01/08/2017   Procedure: CARDIOVERSION;  Surgeon: Othella Boyer,  MD;  Location: [facility] ENDOSCOPY;  Service: Cardiovascular;  Laterality: N/A;   EYE SURGERY     Cataract right with Lens   HERNIA REPAIR     Right x 2 2004, 2005   NASAL SEPTOPLASTY W/ TURBINOPLASTY  12/24/2011   Procedure: NASAL SEPTOPLASTY WITH TURBINATE REDUCTION;  Surgeon: Keturah Barre, MD;  Location: [facility];  Service: ENT;  Laterality: Bilateral;   SHOULDER ARTHROSCOPY WITH DISTAL CLAVICLE RESECTION Left 12/16/2018   Procedure: SHOULDER ARTHROSCOPY WITH DISTAL CLAVICLE RESECTION;  Surgeon: Jones Broom, MD;  Location: [facility] ORS;  Service: Orthopedics;  Laterality: Left;   TEE WITHOUT CARDIOVERSION N/A 08/28/2017   Procedure: TRANSESOPHAGEAL ECHOCARDIOGRAM (TEE);  Surgeon: Quintella Reichert, MD;  Location: [facility] ENDOSCOPY;  Service: Cardiovascular;  Laterality: N/A;   UVULOPALATOPHARYNGOPLASTY  12/24/2011   Procedure: UVULOPALATOPHARYNGOPLASTY (UPPP);  Surgeon: Keturah Barre, MD;  Location: [facility] OR;  Service: ENT;  Laterality: Bilateral;  SEPTAL RECONSTRUCTION ANB BILATERIAL TURBINATE REDUCTION    Current Medications: Current Meds  Medication Sig   apixaban (ELIQUIS) 5 MG TABS tablet Take 1 tablet (5 mg total) by mouth 2 (two) times daily.   bisoprolol (ZEBETA) 5 MG tablet TAKE 1/2 TABLET BY MOUTH EVERY DAY   ibuprofen (ADVIL,MOTRIN) 200 MG tablet Take 400-800 mg by mouth every 6 (six) hours as needed for moderate pain.    lisinopril (ZESTRIL) 2.5 MG tablet TAKE 1 TABLET BY MOUTH EVERY  DAY   Omega-3 Fatty Acids (OMEGA 3 500 PO) Take by mouth.   Vitamins/Minerals TABS Take 1 tablet by mouth daily.     Allergies:   Patient has no known allergies.   Social History   Socioeconomic History   Marital status: Married    Spouse name: Not on file   Number of children: Not on file   Years of education: Not on file   Highest education level: Not on file  Occupational History   Not on file  Tobacco Use   Smoking status: Former    Packs/day: 1.00    Years: 32.00    Pack years: 32.00     Types: Cigarettes    Quit date: 04/08/2005    Years since quitting: 16.3   Smokeless tobacco: Never  Vaping Use   Vaping Use: Never used  Substance and Sexual Activity   Alcohol use: No    Alcohol/week: 0.0 standard drinks   Drug use: No   Sexual activity: Not on file  Other Topics Concern   Not on file  Social History Narrative   Not on file   Social Determinants of Health   Financial Resource Strain: Not on file  Food Insecurity: Not on file  Transportation Needs: Not on file  Physical Activity: Not on file  Stress: Not on file  Social Connections: Not on file     Family History:  The patient's  family history includes Cancer - Other in his father; Heart disease in his mother.   ROS:   Please see the history of present illness.    ROS All other systems reviewed and are negative.   PHYSICAL EXAM:   VS:  BP 140/80 (BP Location: Left Arm, Patient Position: Sitting, Cuff Size: Normal)   Pulse 88   Ht 6' (1.829 m)   Wt 240 lb (108.9 kg)   SpO2 96%   BMI 32.55 kg/m   Physical Exam  GEN: Obese, in no acute distress  Neck: no JVD, carotid bruits, or masses Cardiac:RRR; no murmurs, rubs, or gallops  Respiratory:  clear to auscultation bilaterally, normal work of breathing GI: soft, nontender, nondistended, + BS Ext: without cyanosis, clubbing, or edema, Good distal pulses bilaterally Neuro:  Alert and Oriented x 3, Psych: euthymic mood, full affect  Wt Readings from Last 3 Encounters:  07/24/21 240 lb (108.9 kg)  03/01/20 226 lb 6 oz (102.7 kg)  03/02/19 250 lb (113.4 kg)      Studies/Labs Reviewed:   EKG:  EKG is  ordered today.  The ekg ordered today demonstrates Atrial fib with RBBB/LAHB  Recent Labs: No results found for requested labs within last 8760 hours.   Lipid Panel    Component Value Date/Time   CHOL 179 03/01/2020 0934   TRIG 102 03/01/2020 0934   HDL 50 03/01/2020 0934   CHOLHDL 3.6 03/01/2020 0934   LDLCALC 110 (H) 03/01/2020 0934     Additional studies/ records that were reviewed today include:  Echo 12/03/18 IMPRESSIONS     1. The left ventricle has moderately reduced systolic function, with an  ejection fraction of 35-40%. The cavity size was normal. There is mildly  increased left ventricular wall thickness. Left ventricular diastolic  Doppler parameters are indeterminate  Left ventrical global hypokinesis without regional wall motion  abnormalities.   2. The right ventricle has normal systolic function. The cavity was  normal. There is no increase in right ventricular wall thickness.   3. Left atrial size was moderately  dilated.   4. Trivial pericardial effusion is present.   5. The mitral valve is normal in structure. Mild thickening of the mitral  valve leaflet.   6. The tricuspid valve is normal in structure.   7. The aortic valve is tricuspid Mild thickening of the aortic valve Mild  calcification of the aortic valve. Aortic valve regurgitation is trivial  by color flow Doppler.   8. The pulmonic valve was grossly normal. Pulmonic valve regurgitation is  mild by color flow Doppler.   9. There is mild dilatation of the aortic root measuring 39 mm.   FINDINGS   Left Ventricle: The left ventricle has moderately reduced systolic  function, with an ejection fraction of 35-40%. The cavity size was normal.  There is mildly increased left ventricular wall thickness. Left  ventricular diastolic Doppler parameters are  indeterminate Left ventrical global hypokinesis without regional wall  motion abnormalities.  Right Ventricle: The right ventricle has normal systolic function. The  cavity was normal. There is no increase in right ventricular wall  thickness.  Left Atrium: left atrial size was moderately dilated  Right Atrium: right atrial size was normal in size. Right atrial pressure  is estimated at 3 mmHg.  Interatrial Septum: No atrial level shunt detected by color flow Doppler.  Pericardium: Trivial  pericardial effusion is present. Trivial posterior  pericardial effusion.  Mitral Valve: The mitral valve is normal in structure. Mild thickening of  the mitral valve leaflet. Mitral valve regurgitation is trivial by color  flow Doppler.  Tricuspid Valve: The tricuspid valve is normal in structure. Tricuspid  valve regurgitation is mild by color flow Doppler.  Aortic Valve: The aortic valve is tricuspid Mild thickening of the aortic  valve Mild calcification of the aortic valve. Aortic valve regurgitation  is trivial by color flow Doppler. There is no evidence of aortic valve  stenosis.  Pulmonic Valve: The pulmonic valve was grossly normal. Pulmonic valve  regurgitation is mild by color flow Doppler.  Aorta: There is mild dilatation of the aortic root measuring 39 mm.      Risk Assessment/Calculations:    CHA2DS2-VASc Score = 3   This indicates a 3.2% annual risk of stroke. The patient's score is based upon: CHF History: 1 HTN History: 1 Diabetes History: 0 Stroke History: 0 Vascular Disease History: 0 Age Score: 1 Gender Score: 0        ASSESSMENT:    1. Preoperative clearance   2. Persistent atrial fibrillation (HCC)   3. NICM (nonischemic cardiomyopathy) (HCC)   4. Chronic systolic CHF (congestive heart failure) (HCC)   5. RBBB   6. Obstructive sleep apnea      PLAN:  In order of problems listed above:  Preop clearance for BILATERAL L3/L4/L5 LUMBAR RADIOFREQUENCY ABLATION   [facility] / DR. Regino Schultze  patient has a high METS and asymptomatic from CV standpoint. Will update 2Decho for LVEF to see if we need to switch meds but can proceed with surgery without further workup.  According to the Revised Cardiac Risk Index (RCRI), his Perioperative Risk of Major Cardiac Event is (%): 0.9  His Functional Capacity in METs is: 8.23 according to the [employer] Activity Status Index (DASI).   Persistent Atrial fibrillation now on bisoprolol because of fatigue on  toprol, rate controlled, no bleeding on eliquis-just had blood work by Dr. Jeannetta Nap  NICM EF 40% 2020-update 2Decho could switch lisinopril to entresto if needed  Chronic systolic CHF-compensated  Bifasicular block  OSA has implant  Obesity-weight loss discussed  Shared Decision Making/Informed Consent        Medication Adjustments/Labs and Tests Ordered: Current medicines are reviewed at length with the patient today.  Concerns regarding medicines are outlined above.  Medication changes, Labs and Tests ordered today are listed in the Patient Instructions below. Patient Instructions  Medication Instructions:  Your physician recommends that you continue on your current medications as directed. Please refer to the Current Medication list given to you today.  *If you need a refill on your cardiac medications before your next appointment, please call your pharmacy*   Lab Work: None If you have labs (blood work) drawn today and your tests are completely normal, you will receive your results only by: MyChart Message (if you have MyChart) OR A paper copy in the mail If you have any lab test that is abnormal or we need to change your treatment, we will call you to review the results.   Testing/Procedures: Your physician has requested that you have an echocardiogram. Echocardiography is a painless test that uses sound waves to create images of your heart. It provides your doctor with information about the size and shape of your heart and how well your heart's chambers and valves are working. This procedure takes approximately one hour. There are no restrictions for this procedure.   Follow-Up: At [facility], you and your health needs are our priority.  As part of our continuing mission to provide you with exceptional heart care, we have created designated Provider Care Teams.  These Care Teams include your primary Cardiologist (physician) and Advanced Practice Providers (APPs -   Physician Assistants and Nurse Practitioners) who all work together to provide you with the care you need, when you need it.  We recommend signing up for the patient portal called "MyChart".  Sign up information is provided on this After Visit Summary.  MyChart is used to connect with patients for Virtual Visits (Telemedicine).  Patients are able to view lab/test results, encounter notes, upcoming appointments, etc.  Non-urgent messages can be sent to your provider as well.   To learn more about what you can do with MyChart, go to [email or web address].    Your next appointment:   6 month(s)  The format for your next appointment:   In Person  Provider:   You may see Donato Schultz, MD or one of the following Advanced Practice Providers on your designated Care Team:   Nada Boozer, NP     Signed, Jacolyn Reedy, PA-C  07/24/2021 1:41 PM    [facility] [address] [place], [place], [place]  [address] Phone: [phone]; Fax: [phone]

## 2021-07-24 ENCOUNTER — Ambulatory Visit: Payer: Medicare Other | Admitting: Physician Assistant

## 2021-07-24 ENCOUNTER — Other Ambulatory Visit: Payer: Self-pay

## 2021-07-24 ENCOUNTER — Encounter: Payer: Self-pay | Admitting: Physician Assistant

## 2021-07-24 VITALS — BP 140/80 | HR 88 | Ht 72.0 in | Wt 240.0 lb

## 2021-07-24 DIAGNOSIS — I5022 Chronic systolic (congestive) heart failure: Secondary | ICD-10-CM

## 2021-07-24 DIAGNOSIS — I428 Other cardiomyopathies: Secondary | ICD-10-CM | POA: Diagnosis not present

## 2021-07-24 DIAGNOSIS — Z01818 Encounter for other preprocedural examination: Secondary | ICD-10-CM | POA: Diagnosis not present

## 2021-07-24 DIAGNOSIS — I4819 Other persistent atrial fibrillation: Secondary | ICD-10-CM | POA: Diagnosis not present

## 2021-07-24 DIAGNOSIS — G4733 Obstructive sleep apnea (adult) (pediatric): Secondary | ICD-10-CM

## 2021-07-24 DIAGNOSIS — I4510 Unspecified right bundle-branch block: Secondary | ICD-10-CM

## 2021-07-24 NOTE — Patient Instructions (Signed)
Medication Instructions:  Your physician recommends that you continue on your current medications as directed. Please refer to the Current Medication list given to you today.  *If you need a refill on your cardiac medications before your next appointment, please call your pharmacy*   Lab Work: None If you have labs (blood work) drawn today and your tests are completely normal, you will receive your results only by: MyChart Message (if you have MyChart) OR A paper copy in the mail If you have any lab test that is abnormal or we need to change your treatment, we will call you to review the results.   Testing/Procedures: Your physician has requested that you have an echocardiogram. Echocardiography is a painless test that uses sound waves to create images of your heart. It provides your doctor with information about the size and shape of your heart and how well your heart's chambers and valves are working. This procedure takes approximately one hour. There are no restrictions for this procedure.   Follow-Up: At [facility], you and your health needs are our priority.  As part of our continuing mission to provide you with exceptional heart care, we have created designated Provider Care Teams.  These Care Teams include your primary Cardiologist (physician) and Advanced Practice Providers (APPs -  Physician Assistants and Nurse Practitioners) who all work together to provide you with the care you need, when you need it.  We recommend signing up for the patient portal called "MyChart".  Sign up information is provided on this After Visit Summary.  MyChart is used to connect with patients for Virtual Visits (Telemedicine).  Patients are able to view lab/test results, encounter notes, upcoming appointments, etc.  Non-urgent messages can be sent to your provider as well.   To learn more about what you can do with MyChart, go to [email or web address].    Your next appointment:   6 month(s)  The  format for your next appointment:   In Person  Provider:   You may see Donato Schultz, MD or one of the following Advanced Practice Providers on your designated Care Team:   Nada Boozer, NP

## 2021-08-05 ENCOUNTER — Other Ambulatory Visit: Payer: Self-pay | Admitting: Cardiology

## 2021-08-06 ENCOUNTER — Other Ambulatory Visit: Payer: Self-pay | Admitting: Cardiology

## 2021-08-14 ENCOUNTER — Ambulatory Visit (HOSPITAL_COMMUNITY): Payer: Medicare Other | Attending: Cardiology

## 2021-08-14 ENCOUNTER — Encounter (HOSPITAL_COMMUNITY): Payer: Self-pay | Admitting: Physician Assistant

## 2021-09-18 ENCOUNTER — Ambulatory Visit (HOSPITAL_COMMUNITY): Payer: Medicare Other | Attending: Cardiology

## 2021-09-18 ENCOUNTER — Other Ambulatory Visit: Payer: Self-pay

## 2021-09-18 DIAGNOSIS — I428 Other cardiomyopathies: Secondary | ICD-10-CM | POA: Diagnosis present

## 2021-09-18 LAB — ECHOCARDIOGRAM COMPLETE
AR max vel: 4.22 cm2
AV Area VTI: 4.54 cm2
AV Area mean vel: 4.57 cm2
AV Mean grad: 1 mmHg
AV Peak grad: 1.9 mmHg
Ao pk vel: 0.69 m/s
Area-P 1/2: 3.22 cm2
P 1/2 time: 425 msec
S' Lateral: 3.2 cm

## 2021-09-19 ENCOUNTER — Telehealth: Payer: Self-pay

## 2021-09-19 DIAGNOSIS — I428 Other cardiomyopathies: Secondary | ICD-10-CM

## 2021-09-19 DIAGNOSIS — I5022 Chronic systolic (congestive) heart failure: Secondary | ICD-10-CM

## 2021-09-19 MED ORDER — ENTRESTO 24-26 MG PO TABS: 1.0000 | ORAL_TABLET | Freq: Two times a day (BID) | ORAL | 11 refills | Status: AC

## 2021-09-19 NOTE — Telephone Encounter (Signed)
The patient has been notified of the result and verbalized understanding.  All questions (if any) were answered. Theresia Majors, RN 09/19/2021 10:28 AM  Lab work has been scheduled. Rx has been sent in.  Scheduled patient for follow up for up titration of his meds.

## 2021-09-19 NOTE — Telephone Encounter (Signed)
-----   Message from Dyann Kief, PA-C sent at 09/19/2021  8:03 AM EST ----- Echo shows further decline in heart function now 25-30%. Recommend he stop lisinopril and wait 36 hrs. Then start entresto 24/26 mg bid-please give him a started pack. He'll need a bmet before he does this and another 2-3 weeks after. Please schedule him for f/u with Dr. Anne Fu or myself or APP for further titration.thanks

## 2021-09-20 ENCOUNTER — Other Ambulatory Visit: Payer: Self-pay

## 2021-09-20 ENCOUNTER — Other Ambulatory Visit: Payer: Medicare Other | Admitting: *Deleted

## 2021-09-20 DIAGNOSIS — I428 Other cardiomyopathies: Secondary | ICD-10-CM

## 2021-09-20 DIAGNOSIS — I5022 Chronic systolic (congestive) heart failure: Secondary | ICD-10-CM

## 2021-09-20 LAB — BASIC METABOLIC PANEL
BUN/Creatinine Ratio: 23 (ref 10–24)
BUN: 22 mg/dL (ref 8–27)
CO2: 26 mmol/L (ref 20–29)
Calcium: 9.5 mg/dL (ref 8.6–10.2)
Chloride: 102 mmol/L (ref 96–106)
Creatinine, Ser: 0.96 mg/dL (ref 0.76–1.27)
Glucose: 95 mg/dL (ref 70–99)
Potassium: 4.1 mmol/L (ref 3.5–5.2)
Sodium: 143 mmol/L (ref 134–144)
eGFR: 87 mL/min/{1.73_m2} (ref >59)

## 2021-09-25 ENCOUNTER — Telehealth: Payer: Self-pay | Admitting: Physician Assistant

## 2021-09-25 NOTE — Telephone Encounter (Signed)
New Message:    Patient wants to know if you have a Coupon for [facility] that he can get please? He says the Waylan Rocher is so expensive..,  He said to please let him know asap please, he needs to get the medicine today.

## 2021-09-25 NOTE — Telephone Encounter (Signed)
I called pt to inform him that I would be leaving a 30 day free coupon for Entresto at the front desk for pt to pick up. I advised the pt that if he has any other problems, questions or concerns, please give our office a call back. Pt verbalized understanding. FYI

## 2021-10-09 ENCOUNTER — Other Ambulatory Visit: Payer: Medicare Other | Admitting: *Deleted

## 2021-10-09 ENCOUNTER — Other Ambulatory Visit: Payer: Self-pay

## 2021-10-10 LAB — BASIC METABOLIC PANEL
BUN/Creatinine Ratio: 16 (ref 10–24)
BUN: 15 mg/dL (ref 8–27)
CO2: 24 mmol/L (ref 20–29)
Calcium: 9.2 mg/dL (ref 8.6–10.2)
Chloride: 103 mmol/L (ref 96–106)
Creatinine, Ser: 0.95 mg/dL (ref 0.76–1.27)
Glucose: 89 mg/dL (ref 70–99)
Potassium: 4.4 mmol/L (ref 3.5–5.2)
Sodium: 140 mmol/L (ref 134–144)
eGFR: 88 mL/min/{1.73_m2} (ref >59)

## 2021-10-21 ENCOUNTER — Ambulatory Visit: Payer: Medicare Other

## 2021-10-21 NOTE — Progress Notes (Deleted)
Patient ID: Charles Kent.                 DOB: [birth year]                      MRN: [id number]     HPI: Charles Kent. is a 66 y.o. male patient of Dr. Anne Kent referred by Charles Scrape, PA to Charles Kent for HF medication management. PMH is significant for permanent Afib, NICM EF 35-40% on echo 11/2018, chronic systolic CHF, bifasicular block, OSA on CPAP.Marland Kitchen Most recent LVEF 24-30% on 09/18/21. This is a decline from 30-35% 2 years ago. His lisinopril was chabged to [facility] 24/26mg  BID after Echo results. Follow up BMP was stable  Today she returns to Charles Kent for further medication titration. At last visit with MD ***. Symptomatically, she is feeling ***, *** dizziness, lightheadedness, and fatigue. *** chest pain or palpitations. Feels SOB when ***. Able to complete all ADLs. Activity level ***. She *** checks her weight at home (normal range *** - *** lbs). *** LEE, PND, or orthopnea. Appetite has been ***. She *** adheres to a low-salt diet.  [facility] medicare Patient assistance? Cleda Daub Increase entresto  Current CHF meds: Entresto 24/26mg  twice a day, bisoprolol 2.5mg  daily Previously tried: metoprolol succinate (fatigue and ED) BP goal: <130/80  Family History: The patient's  family history includes Cancer - Other in his father; Heart disease in his mother.   Social History:   Diet:   Exercise:   Home BP readings:   Wt Readings from Last 3 Encounters:  07/24/21 240 lb (108.9 kg)  03/01/20 226 lb 6 oz (102.7 kg)  03/02/19 250 lb (113.4 kg)   BP Readings from Last 3 Encounters:  07/24/21 140/80  03/01/20 126/90  12/16/18 (!) 117/92   Pulse Readings from Last 3 Encounters:  07/24/21 88  03/01/20 87  12/16/18 65    Renal function: CrCl cannot be calculated (Unknown ideal weight.).  Past Medical History:  Diagnosis Date   Anticoagulant long-term use    Cardiomyopathy (HCC)    Chronic systolic heart failure (HCC) 01/05/2017   Followup due to tachycardia  related cardiomyopathy due to atrial fibrillation   Dyspnea    Dysrhythmia    Atrial Fibrillation   GERD (gastroesophageal reflux disease)    H/O hiatal hernia    Obesity    Persistent atrial fibrillation (HCC) 01/05/2017   CHA2DS2VASC score 2 Diagnosis February 2018   Renal stones    Sleep apnea    no CPAP, has nerve generator for sleep apnea    Current Outpatient Medications on File Prior to Visit  Medication Sig Dispense Refill   apixaban (ELIQUIS) 5 MG TABS tablet Take 1 tablet (5 mg total) by mouth 2 (two) times daily. 60 tablet 10   bisoprolol (ZEBETA) 5 MG tablet TAKE 1/2 TABLET BY MOUTH EVERY DAY 45 tablet 1   ibuprofen (ADVIL,MOTRIN) 200 MG tablet Take 400-800 mg by mouth every 6 (six) hours as needed for moderate pain.      Omega-3 Fatty Acids (OMEGA 3 500 PO) Take by mouth.     sacubitril-valsartan (ENTRESTO) 24-26 MG Take 1 tablet by mouth 2 (two) times daily. 60 tablet 11   Vitamins/Minerals TABS Take 1 tablet by mouth daily.     No current facility-administered medications on file prior to visit.    No Known Allergies   Assessment/Plan:  1. CHF -

## 2022-01-22 ENCOUNTER — Encounter: Payer: Self-pay | Admitting: Pulmonary Disease

## 2022-01-22 ENCOUNTER — Ambulatory Visit (INDEPENDENT_AMBULATORY_CARE_PROVIDER_SITE_OTHER): Payer: Medicare Other

## 2022-01-22 ENCOUNTER — Ambulatory Visit: Payer: Medicare Other | Admitting: Pulmonary Disease

## 2022-01-22 VITALS — BP 118/72 | HR 48 | Ht 72.0 in | Wt 243.0 lb

## 2022-01-22 DIAGNOSIS — R0602 Shortness of breath: Secondary | ICD-10-CM

## 2022-01-22 DIAGNOSIS — G4733 Obstructive sleep apnea (adult) (pediatric): Secondary | ICD-10-CM

## 2022-01-22 MED ORDER — STIOLTO RESPIMAT 2.5-2.5 MCG/ACT IN AERS: 2.0000 | INHALATION_SPRAY | Freq: Every day | RESPIRATORY_TRACT | 0 refills | Status: DC

## 2022-01-22 NOTE — Progress Notes (Signed)
Patient seen in the office today and instructed on use of Stiolto.  Patient expressed understanding and demonstrated technique. ? ?Charles Kent CMA 01/22/2022 ?

## 2022-01-22 NOTE — Progress Notes (Signed)
? ?Synopsis: Referred in April 2023 for shortness of breath by Leonard Downing, MD ? ?Subjective:  ? ?PATIENT ID: Charles Kent. GENDER: male DOB: [birth year], MRN: [id number] ? ?HPI ? ?Chief Complaint  ?Patient presents with  ? Consult  ?  Self referral for increased SOB and wheezing for the past 3-4 months.   ? ?Charles Kent is a 66 year old male, former smoker with HFrEF, atrial fibrillation, GERD, sleep apnea and obesity who is referred to pulmonary clinic for shortness of breath.  ? ?He has noticed increasing shortness of breath over the past 2-3 months with intermittent cough and wheezing. He has not tried any inhalers for his shortness of breath or wheezing. He is followed by Dr. Marlou Porch for his atrial fibrillation and heart failure, last visit was in 2021. He has Inspire device for sleep apnea treatment but is not consistently using it.  ? ?He quit smoking around 2005-2006. He has a 35 pack year smoking history. He denies any dust or chemical exposures through work or hobbies in the past. He enjoys riding motorcycles in his spare time.  ? ?Spirometry suggestive of restrictive defect in 2017.  ? ?Past Medical History:  ?Diagnosis Date  ? Anticoagulant long-term use   ? Cardiomyopathy ([place])   ? Chronic systolic heart failure ([place]) 01/05/2017  ? 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 Dyspnea   ? Dysrhythmia   ? Atrial Fibrillation  ? GERD (gastroesophageal reflux disease)   ? H/O hiatal hernia   ? Obesity   ? Persistent atrial fibrillation ([place]) 01/05/2017  ? CHA2DS2VASC score 2 Diagnosis February 2018  ? Renal stones   ? Sleep apnea   ? no CPAP, has nerve generator for sleep apnea  ?  ? ?Family History  ?Problem Relation Age of Onset  ? Heart disease Mother   ? Cancer - Other Father   ?     kidney  ? Anesthesia problems Neg Hx   ?  ? ?Social History  ? ?Socioeconomic History  ? Marital status: Married  ?  Spouse name: Not on file  ? Number of children: Not on file   ? Years of education: Not on file  ? Highest education level: Not on file  ?Occupational History  ? Not on file  ?Tobacco Use  ? Smoking status: Former  ?  Packs/day: 1.00  ?  Years: 32.00  ?  Pack years: 32.00  ?  Types: Cigarettes  ?  Quit date: 04/08/2005  ?  Years since quitting: 16.8  ? Smokeless tobacco: Never  ?Vaping Use  ? Vaping Use: Never used  ?Substance and Sexual Activity  ? Alcohol use: No  ?  Alcohol/week: 0.0 standard drinks  ? Drug use: No  ? Sexual activity: Not on file  ?Other Topics Concern  ? Not on file  ?Social History Narrative  ? Not on file  ? ?Social Determinants of Health  ? ?Financial Resource Strain: Not on file  ?Food Insecurity: Not on file  ?Transportation Needs: Not on file  ?Physical Activity: Not on file  ?Stress: Not on file  ?Social Connections: Not on file  ?Intimate Partner Violence: Not on file  ?  ? ?No Known Allergies  ? ?Outpatient Medications Prior to Visit  ?Medication Sig Dispense Refill  ? apixaban (ELIQUIS) 5 MG TABS tablet Take 1 tablet (5 mg total) by mouth 2 (two) times daily. 60 tablet 10  ? ibuprofen (ADVIL,MOTRIN) 200 MG  tablet Take 400-800 mg by mouth every 6 (six) hours as needed for moderate pain.     ? Omega-3 Fatty Acids (OMEGA 3 500 PO) Take by mouth.    ? sacubitril-valsartan (ENTRESTO) 24-26 MG Take 1 tablet by mouth 2 (two) times daily. 60 tablet 11  ? Vitamins/Minerals TABS Take 1 tablet by mouth daily.    ? bisoprolol (ZEBETA) 5 MG tablet TAKE 1/2 TABLET BY MOUTH EVERY DAY 45 tablet 1  ? ?No facility-administered medications prior to visit.  ? ?Review of Systems  ?Constitutional:  Negative for chills, fever, malaise/fatigue and weight loss.  ?HENT:  Negative for congestion, sinus pain and sore throat.   ?Eyes: Negative.   ?Respiratory:  Positive for cough, shortness of breath and wheezing. Negative for hemoptysis and sputum production.   ?Cardiovascular:  Positive for leg swelling. Negative for chest pain, palpitations, orthopnea and claudication.   ?Gastrointestinal:  Negative for abdominal pain, heartburn, nausea and vomiting.  ?Genitourinary: Negative.   ?Musculoskeletal:  Negative for joint pain and myalgias.  ?Skin:  Negative for rash.  ?Neurological:  Negative for weakness.  ?Endo/Heme/Allergies: Negative.   ?Psychiatric/Behavioral: Negative.    ? ?Objective:  ? ?Vitals:  ? 01/22/22 1527  ?BP: 118/72  ?Pulse: (!) 48  ?SpO2: 96%  ?Weight: 243 lb (110.2 kg)  ?Height: 6' (1.829 m)  ? ?Physical Exam ?Constitutional:   ?   General: He is not in acute distress. ?   Appearance: He is obese.  ?HENT:  ?   Head: Normocephalic and atraumatic.  ?Eyes:  ?   Extraocular Movements: Extraocular movements intact.  ?   Conjunctiva/sclera: Conjunctivae normal.  ?   Pupils: Pupils are equal, round, and reactive to light.  ?Cardiovascular:  ?   Rate and Rhythm: Normal rate and regular rhythm.  ?   Pulses: Normal pulses.  ?   Heart sounds: Normal heart sounds. No murmur heard. ?Pulmonary:  ?   Effort: Pulmonary effort is normal.  ?   Breath sounds: Normal breath sounds. No wheezing, rhonchi or rales.  ?Abdominal:  ?   General: Bowel sounds are normal.  ?   Palpations: Abdomen is soft.  ?Musculoskeletal:  ?   Right lower leg: Edema (trace) present.  ?   Left lower leg: Edema (trace) present.  ?Lymphadenopathy:  ?   Cervical: No cervical adenopathy.  ?Skin: ?   General: Skin is warm and dry.  ?Neurological:  ?   General: No focal deficit present.  ?   Mental Status: He is alert.  ?Psychiatric:     ?   Mood and Affect: Mood normal.     ?   Behavior: Behavior normal.     ?   Thought Content: Thought content normal.     ?   Judgment: Judgment normal.  ? ?CBC ?   ?Component Value Date/Time  ? WBC 6.1 03/01/2020 0934  ? WBC 4.8 12/14/2018 1107  ? RBC 5.20 03/01/2020 0934  ? RBC 5.07 12/14/2018 1107  ? HGB 17.3 03/01/2020 0934  ? HCT 50.9 03/01/2020 0934  ? PLT 187 03/01/2020 0934  ? MCV 98 (H) 03/01/2020 0934  ? MCH 33.3 (H) 03/01/2020 0934  ? MCH 32.5 12/14/2018 1107  ? MCHC 34.0  03/01/2020 0934  ? MCHC 32.7 12/14/2018 1107  ? RDW 13.8 03/01/2020 0934  ? LYMPHSABS 1.2 08/19/2017 0953  ? MONOABS 0.8 05/22/2016 1716  ? EOSABS 0.1 08/19/2017 0953  ? BASOSABS 0.0 08/19/2017 0953  ? ? ?  Latest Ref  Rng & Units 10/09/2021  ? 11:23 AM 09/20/2021  ? 12:25 PM 03/01/2020  ?  9:34 AM  ?BMP  ?Glucose 70 - 99 mg/dL 89   95   98    ?BUN 8 - 27 mg/dL 15   22   20     ?Creatinine 0.76 - 1.27 mg/dL 0.95   0.96   0.88    ?BUN/Creat Ratio 10 - 24 16   23   23     ?Sodium 134 - 144 mmol/L 140   143   142    ?Potassium 3.5 - 5.2 mmol/L 4.4   4.1   4.8    ?Chloride 96 - 106 mmol/L 103   102   102    ?CO2 20 - 29 mmol/L 24   26   26     ?Calcium 8.6 - 10.2 mg/dL 9.2   9.5   9.4    ?  ?Chest imaging: ?CXR 12/16/18 ?Chronic cardiomegaly without acute abnormality. ? ?PFT: ?   ? View : No data to display.  ?  ?  ?  ? ? ?Labs: ? ?Path: ? ?Echo 2019: ?LV EF 25-30%. RV systolic function is mildly reduced. RV size is mildly enlarged. LA moderately dilated. RA moderately dilated.  ? ?Heart Catheterization: ? ?Assessment & Plan:  ? ?Shortness of breath - Plan: Pulmonary Function Test, DG Chest 2 View ? ?Obstructive sleep apnea ? ?Discussion: ?Cleatus Nitta is a 66 year old male, former smoker with HFrEF, atrial fibrillation, GERD, sleep apnea and obesity who is referred to pulmonary clinic for shortness of breath.  ? ?Given his history of smoking and clinical symptoms of dyspnea, cough and wheezing there is concern for obstructive lung disease. Spirometry from 2017 is suggestive of restriction.  ? ?He is to try stiolto 2 puffs daily and monitor for improvement in his symptoms. He is to let us know if he would like a prescription to continue this medication.  ? ?I have recommended he follow up with Dr. Marlou Porch regarding his heart failure.  ? ?He is to use the Inspire device nightly for his sleep apnea.  ? ?Follow up in 2 months with pulmonary function tests.  ? ?Freda Jackson, MD ?[facility] ?Office:  [phone] ? ? ?Current Outpatient Medications:  ?  apixaban (ELIQUIS) 5 MG TABS tablet, Take 1 tablet (5 mg total) by mouth 2 (two) times daily., Disp: 60 tablet, Rfl: 10 ?  ibuprofen (ADVIL,MOTRIN) 200 MG

## 2022-01-22 NOTE — Patient Instructions (Signed)
We will check a chest x-ray today and let you know the results. We will check a CT Chest scan of the lungs if there are any issues on the x-ray. ? ?Try stiolto 2 puffs daily  ?- if you notice improvement in your breathing, let us know and we will send in prescription ? ?Recommend following up with Dr. Anne Fu to monitor your heart ? ?Follow up in 2 months for pulmonary function tests.  ?

## 2022-01-23 ENCOUNTER — Encounter: Payer: Self-pay | Admitting: Pulmonary Disease

## 2022-01-28 ENCOUNTER — Telehealth: Payer: Self-pay | Admitting: Pulmonary Disease

## 2022-01-28 MED ORDER — MONTELUKAST SODIUM 10 MG PO TABS: 10.0000 mg | ORAL_TABLET | Freq: Every day | ORAL | 1 refills | Status: AC

## 2022-01-28 NOTE — Telephone Encounter (Signed)
I called the wife and gave her the results and she did not have any question. The wife asked for Singular and wants some and per Dr. Francine Graven it was ok to send in a Rx. Nothing further needed.  ?

## 2022-03-27 ENCOUNTER — Ambulatory Visit (INDEPENDENT_AMBULATORY_CARE_PROVIDER_SITE_OTHER): Payer: Medicare Other | Admitting: Pulmonary Disease

## 2022-03-27 ENCOUNTER — Encounter: Payer: Self-pay | Admitting: Pulmonary Disease

## 2022-03-27 VITALS — BP 118/68 | HR 35 | Ht 71.0 in | Wt 251.4 lb

## 2022-03-27 DIAGNOSIS — R0602 Shortness of breath: Secondary | ICD-10-CM

## 2022-03-27 DIAGNOSIS — G4733 Obstructive sleep apnea (adult) (pediatric): Secondary | ICD-10-CM

## 2022-03-27 DIAGNOSIS — J449 Chronic obstructive pulmonary disease, unspecified: Secondary | ICD-10-CM

## 2022-03-27 LAB — PULMONARY FUNCTION TEST
DL/VA % pred: 115 %
DL/VA: 4.69 ml/min/mmHg/L
DLCO cor % pred: 84 %
DLCO cor: 23.35 ml/min/mmHg
DLCO unc % pred: 84 %
DLCO unc: 23.35 ml/min/mmHg
FEF 25-75 Post: 3.23 L/sec
FEF 25-75 Pre: 2.29 L/sec
FEF2575-%Change-Post: 41 %
FEF2575-%Pred-Post: 118 %
FEF2575-%Pred-Pre: 84 %
FEV1-%Change-Post: 11 %
FEV1-%Pred-Post: 69 %
FEV1-%Pred-Pre: 62 %
FEV1-Post: 2.44 L
FEV1-Pre: 2.19 L
FEV1FVC-%Change-Post: 4 %
FEV1FVC-%Pred-Pre: 110 %
FEV6-%Change-Post: 7 %
FEV6-%Pred-Post: 63 %
FEV6-%Pred-Pre: 59 %
FEV6-Post: 2.88 L
FEV6-Pre: 2.69 L
FEV6FVC-%Pred-Post: 105 %
FEV6FVC-%Pred-Pre: 105 %
FVC-%Change-Post: 7 %
FVC-%Pred-Post: 60 %
FVC-%Pred-Pre: 56 %
FVC-Post: 2.88 L
FVC-Pre: 2.69 L
Post FEV1/FVC ratio: 85 %
Post FEV6/FVC ratio: 100 %
Pre FEV1/FVC ratio: 82 %
Pre FEV6/FVC Ratio: 100 %
RV % pred: 94 %
RV: 2.35 L
TLC % pred: 75 %
TLC: 5.56 L

## 2022-03-27 MED ORDER — STIOLTO RESPIMAT 2.5-2.5 MCG/ACT IN AERS: 2.0000 | INHALATION_SPRAY | Freq: Every day | RESPIRATORY_TRACT | 11 refills | Status: DC

## 2022-03-27 NOTE — Patient Instructions (Signed)
Full PFT Performed Today  

## 2022-03-27 NOTE — Patient Instructions (Addendum)
We will send in prescription for stiolto or similar inhaler to continue since it helped your breathing.   Your breathing tests show mild restrictive defect which is similar to back in 2017  Recommend follow up with Dr. Anne Fu for your heart failure  Follow up in 1 year

## 2023-04-29 ENCOUNTER — Encounter: Payer: Self-pay | Admitting: Pulmonary Disease

## 2023-04-29 ENCOUNTER — Ambulatory Visit: Payer: Medicare Other | Admitting: Pulmonary Disease

## 2023-04-29 VITALS — BP 112/70 | HR 63 | Temp 98.2°F | Ht 72.0 in | Wt 217.4 lb

## 2023-04-29 DIAGNOSIS — J449 Chronic obstructive pulmonary disease, unspecified: Secondary | ICD-10-CM | POA: Diagnosis not present

## 2023-04-29 MED ORDER — BREZTRI AEROSPHERE 160-9-4.8 MCG/ACT IN AERO
2.0000 | INHALATION_SPRAY | Freq: Two times a day (BID) | RESPIRATORY_TRACT | 6 refills | Status: AC
Start: 2023-04-29 — End: ?

## 2023-04-29 NOTE — Patient Instructions (Signed)
Use breztri inhaler 2 puffs twice daily - rinse mouth out after each use  Follow up in 1 year, call sooner if needed

## 2023-04-29 NOTE — Progress Notes (Signed)
Synopsis: Referred in April 2023 for shortness of breath by Kaleen Mask, MD  Subjective:   PATIENT ID: Charles Kent. GENDER: male DOB: 08-01-1956, MRN: [id number]  HPI  Chief Complaint  Patient presents with   Follow-up    Breathing has been some better since the last visit.    Charles Kent is a 67 year old male, former smoker with HFrEF, atrial fibrillation, GERD, sleep apnea and obesity who returns to pulmonary clinic for COPD.   He has not been using stiolto regularly. His PCP gave him breztri which he is using as needed. He has intermittent sinus congestion/drainage.   He has a. Fib ablation 2-3 months ago and has been feeling much better since then. Less dyspnea and fatigue. He almost feels back to normal.   OV 03/27/22 He was started on stiolto at last visit and noted improvement in his breathing.  PFTs today show mild restrictive defect.   He has appointment with [facility] regarding his atrial fibrillation tomorrow.  Initial OV 01/22/22 He has noticed increasing shortness of breath over the past 2-3 months with intermittent cough and wheezing. He has not tried any inhalers for his shortness of breath or wheezing. He is followed by Dr. Anne Fu for his atrial fibrillation and heart failure, last visit was in 2021. He has Inspire device for sleep apnea treatment but is not consistently using it.   He quit smoking around 2005-2006. He has a 35 pack year smoking history. He denies any dust or chemical exposures through work or hobbies in the past. He enjoys riding motorcycles in his spare time.   Spirometry suggestive of restrictive defect in 2017.   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   Dyspnea    Dysrhythmia    Atrial Fibrillation   GERD (gastroesophageal reflux  disease)    H/O hiatal hernia    Obesity    Persistent atrial fibrillation ([facility]) 01/05/2017   CHA2DS2VASC score 2 Diagnosis February 2018   Renal stones    Sleep apnea    no CPAP, has nerve generator for sleep apnea     Family History  Problem Relation Age of Onset   Heart disease Mother    Cancer - Other Father        kidney   Anesthesia problems Neg Hx      Social History   Socioeconomic History   Marital status: Married    Spouse name: Not on file   Number of children: Not on file   Years of education: Not on file   Highest education level: Not on file  Occupational History   Not on file  Tobacco Use   Smoking status: Former    Current packs/day: 0.00    Average packs/day: 1 pack/day for 32.0 years (32.0 ttl pk-yrs)    Types: Cigarettes    Start date: 04/08/1973    Quit date: 04/08/2005    Years since quitting: 18.0   Smokeless tobacco: Never  Vaping Use   Vaping status: Never Used  Substance and Sexual Activity   Alcohol use: No    Alcohol/week: 0.0 standard drinks of alcohol   Drug use: No   Sexual activity: Not on file  Other Topics Concern   Not on file  Social History Narrative   Not on file  Social Determinants of Health   Financial Resource Strain: Not on file  Food Insecurity: Not on file  Transportation Needs: Not on file  Physical Activity: Not on file  Stress: Not on file  Social Connections: Not on file  Intimate Partner Violence: Not on file     No Known Allergies   Outpatient Medications Prior to Visit  Medication Sig Dispense Refill   AMIODARONE HCL PO Take 1 tablet by mouth at bedtime.     apixaban (ELIQUIS) 5 MG TABS tablet Take 1 tablet (5 mg total) by mouth 2 (two) times daily. 60 tablet 10   ibuprofen (ADVIL,MOTRIN) 200 MG tablet Take 400-800 mg by mouth every 6 (six) hours as needed for moderate pain.      montelukast (SINGULAIR) 10 MG tablet Take 1 tablet (10 mg total) by mouth at bedtime. (Patient taking differently: Take 10 mg  by mouth daily as needed.) 90 tablet 1   Omega-3 Fatty Acids (OMEGA 3 500 PO) Take by mouth.     sacubitril-valsartan (ENTRESTO) 24-26 MG Take 1 tablet by mouth 2 (two) times daily. 60 tablet 11   Semaglutide (OZEMPIC, 2 MG/DOSE, [place]) Inject into the skin as directed.     Vitamins/Minerals TABS Take 1 tablet by mouth daily.     Budeson-Glycopyrrol-Formoterol (BREZTRI AEROSPHERE) 160-9-4.8 MCG/ACT AERO Inhale 2 puffs into the lungs in the morning and at bedtime.     Tiotropium Bromide-Olodaterol (STIOLTO RESPIMAT) 2.5-2.5 MCG/ACT AERS Inhale 2 puffs into the lungs daily. 8 g 0   Tiotropium Bromide-Olodaterol (STIOLTO RESPIMAT) 2.5-2.5 MCG/ACT AERS Inhale 2 puffs into the lungs daily. 4 g 11   No facility-administered medications prior to visit.   Review of Systems  Constitutional:  Negative for chills, fever, malaise/fatigue and weight loss.  HENT:  Negative for congestion, sinus pain and sore throat.   Eyes: Negative.   Respiratory:  Positive for shortness of breath. Negative for cough, hemoptysis, sputum production and wheezing.   Cardiovascular:  Positive for leg swelling. Negative for chest pain, palpitations, orthopnea and claudication.  Gastrointestinal:  Negative for abdominal pain, heartburn, nausea and vomiting.  Genitourinary: Negative.   Musculoskeletal:  Negative for joint pain and myalgias.  Skin:  Negative for rash.  Neurological:  Negative for weakness.  Endo/Heme/Allergies: Negative.   Psychiatric/Behavioral: Negative.      Objective:   Vitals:   04/29/23 0955  BP: 112/70  Pulse: 63  Temp: 98.2 F (36.8 C)  TempSrc: Oral  SpO2: 95%  Weight: 217 lb 6.4 oz (98.6 kg)  Height: 6' (1.829 m)    Physical Exam Constitutional:      General: He is not in acute distress.    Appearance: He is obese.  HENT:     Head: Normocephalic and atraumatic.  Eyes:     Conjunctiva/sclera: Conjunctivae normal.  Cardiovascular:     Rate and Rhythm: Normal rate and regular rhythm.      Pulses: Normal pulses.     Heart sounds: Normal heart sounds. No murmur heard. Pulmonary:     Effort: Pulmonary effort is normal.     Breath sounds: Normal breath sounds. No wheezing, rhonchi or rales.  Musculoskeletal:     Right lower leg: No edema.     Left lower leg: No edema.  Skin:    General: Skin is warm and dry.  Neurological:     General: No focal deficit present.     Mental Status: He is alert.    CBC    Component Value  Date/Time   WBC 6.1 03/01/2020 0934   WBC 4.8 12/14/2018 1107   RBC 5.20 03/01/2020 0934   RBC 5.07 12/14/2018 1107   HGB 17.3 03/01/2020 0934   HCT 50.9 03/01/2020 0934   PLT 187 03/01/2020 0934   MCV 98 (H) 03/01/2020 0934   MCH 33.3 (H) 03/01/2020 0934   MCH 32.5 12/14/2018 1107   MCHC 34.0 03/01/2020 0934   MCHC 32.7 12/14/2018 1107   RDW 13.8 03/01/2020 0934   LYMPHSABS 1.2 08/19/2017 0953   MONOABS 0.8 05/22/2016 1716   EOSABS 0.1 08/19/2017 0953   BASOSABS 0.0 08/19/2017 0953      Latest Ref Rng & Units 10/09/2021   11:23 AM 09/20/2021   12:25 PM 03/01/2020    9:34 AM  BMP  Glucose 70 - 99 mg/dL 89  95  98   BUN 8 - 27 mg/dL 15  22  20    Creatinine 0.76 - 1.27 mg/dL 1.61  0.96  0.45   BUN/Creat Ratio 10 - 24 16  23  23    Sodium 134 - 144 mmol/L 140  143  142   Potassium 3.5 - 5.2 mmol/L 4.4  4.1  4.8   Chloride 96 - 106 mmol/L 103  102  102   CO2 20 - 29 mmol/L 24  26  26    Calcium 8.6 - 10.2 mg/dL 9.2  9.5  9.4     Chest imaging: [facility]A Chest at [facility] 02/27/23 Left chest wall dual-lead pacemaker with leads terminating in the right  atrial appendage and right ventricle. Incompletely imaged right chest wall  phrenic nerve stimulator.   Posterior dependent atelectasis. No consolidation. Mild coronary artery  calcifications. Retained secretions in the right mainstem bronchus. No  pneumothorax or pleural effusion. Multiple remote left rib fractures. No  acute soft tissue abnormality.   CXR 12/16/18 Chronic cardiomegaly without  acute abnormality.  PFT:    Latest Ref Rng & Units 03/27/2022    9:11 AM  PFT Results  FVC-Pre L 2.69   FVC-Predicted Pre % 56   FVC-Post L 2.88   FVC-Predicted Post % 60   Pre FEV1/FVC % % 82   Post FEV1/FCV % % 85   FEV1-Pre L 2.19   FEV1-Predicted Pre % 62   FEV1-Post L 2.44   DLCO uncorrected ml/min/mmHg 23.35   DLCO UNC% % 84   DLCO corrected ml/min/mmHg 23.35   DLCO COR %Predicted % 84   DLVA Predicted % 115   TLC L 5.56   TLC % Predicted % 75   RV % Predicted % 94     Labs:  Path:  Echo 2019: LV EF 25-30%. RV systolic function is mildly reduced. RV size is mildly enlarged. LA moderately dilated. RA moderately dilated.   Heart Catheterization:  Assessment & Plan:   Chronic obstructive pulmonary disease, unspecified COPD type (HCC) - Plan: Budeson-Glycopyrrol-Formoterol (BREZTRI AEROSPHERE) 160-9-4.8 MCG/ACT AERO  Discussion: Charles Kent is a 67 year old male, former smoker with HFrEF, atrial fibrillation, GERD, sleep apnea and obesity who returns to pulmonary clinic for COPD.   He has mild restrictive defect on PFTs today and does benefit from LAMA/LABA inhaler therapy. He has been using breztri as needed. Will send in prescriptions for breztri today.   He had CTA Chest done at [facility] in May, no concerning nodules or scarring noted per their report.  Follow up in 1 year.  Melody Comas, MD [facility] Office: [phone]   Current Outpatient  Medications:    AMIODARONE HCL PO, Take 1 tablet by mouth at bedtime., Disp: , Rfl:    apixaban (ELIQUIS) 5 MG TABS tablet, Take 1 tablet (5 mg total) by mouth 2 (two) times daily., Disp: 60 tablet, Rfl: 10   Budeson-Glycopyrrol-Formoterol (BREZTRI AEROSPHERE) 160-9-4.8 MCG/ACT AERO, Inhale 2 puffs into the lungs in the morning and at bedtime., Disp: 10.7 g, Rfl: 6   ibuprofen (ADVIL,MOTRIN) 200 MG tablet, Take 400-800 mg by mouth every 6 (six) hours as needed for moderate pain. , Disp: ,  Rfl:    montelukast (SINGULAIR) 10 MG tablet, Take 1 tablet (10 mg total) by mouth at bedtime. (Patient taking differently: Take 10 mg by mouth daily as needed.), Disp: 90 tablet, Rfl: 1   Omega-3 Fatty Acids (OMEGA 3 500 PO), Take by mouth., Disp: , Rfl:    sacubitril-valsartan (ENTRESTO) 24-26 MG, Take 1 tablet by mouth 2 (two) times daily., Disp: 60 tablet, Rfl: 11   Semaglutide (OZEMPIC, 2 MG/DOSE, [place]), Inject into the skin as directed., Disp: , Rfl:    Vitamins/Minerals TABS, Take 1 tablet by mouth daily., Disp: , Rfl:
